# Patient Record
Sex: Male | Born: 1945
Health system: Southern US, Community
[De-identification: ages and names within clinical notes are randomized; demographics above are authoritative.]

## PROBLEM LIST (undated history)

## (undated) DIAGNOSIS — N289 Disorder of kidney and ureter, unspecified: Secondary | ICD-10-CM

## (undated) DIAGNOSIS — H9193 Unspecified hearing loss, bilateral: Secondary | ICD-10-CM

## (undated) DIAGNOSIS — R413 Other amnesia: Secondary | ICD-10-CM

---

## 2011-05-05 DIAGNOSIS — I10 Essential (primary) hypertension: Secondary | ICD-10-CM | POA: Diagnosis not present

## 2011-05-05 DIAGNOSIS — E7800 Pure hypercholesterolemia, unspecified: Secondary | ICD-10-CM | POA: Diagnosis not present

## 2011-05-05 DIAGNOSIS — F17200 Nicotine dependence, unspecified, uncomplicated: Secondary | ICD-10-CM | POA: Diagnosis not present

## 2011-05-05 DIAGNOSIS — N189 Chronic kidney disease, unspecified: Secondary | ICD-10-CM | POA: Diagnosis not present

## 2011-05-17 DIAGNOSIS — I129 Hypertensive chronic kidney disease with stage 1 through stage 4 chronic kidney disease, or unspecified chronic kidney disease: Secondary | ICD-10-CM | POA: Diagnosis not present

## 2011-05-17 DIAGNOSIS — N2589 Other disorders resulting from impaired renal tubular function: Secondary | ICD-10-CM | POA: Diagnosis not present

## 2011-05-17 DIAGNOSIS — N189 Chronic kidney disease, unspecified: Secondary | ICD-10-CM | POA: Diagnosis not present

## 2011-05-17 DIAGNOSIS — Z7982 Long term (current) use of aspirin: Secondary | ICD-10-CM | POA: Diagnosis not present

## 2011-05-17 DIAGNOSIS — Z79899 Other long term (current) drug therapy: Secondary | ICD-10-CM | POA: Diagnosis not present

## 2011-05-17 DIAGNOSIS — Z23 Encounter for immunization: Secondary | ICD-10-CM | POA: Diagnosis not present

## 2011-05-18 DIAGNOSIS — N183 Chronic kidney disease, stage 3 unspecified: Secondary | ICD-10-CM | POA: Insufficient documentation

## 2011-05-18 DIAGNOSIS — I10 Essential (primary) hypertension: Secondary | ICD-10-CM | POA: Insufficient documentation

## 2011-07-29 DIAGNOSIS — Z125 Encounter for screening for malignant neoplasm of prostate: Secondary | ICD-10-CM | POA: Diagnosis not present

## 2011-07-29 DIAGNOSIS — I10 Essential (primary) hypertension: Secondary | ICD-10-CM | POA: Diagnosis not present

## 2011-07-29 DIAGNOSIS — Z0000 Encounter for general adult medical examination without abnormal findings: Secondary | ICD-10-CM | POA: Diagnosis not present

## 2011-10-28 DIAGNOSIS — I10 Essential (primary) hypertension: Secondary | ICD-10-CM | POA: Diagnosis not present

## 2011-10-28 DIAGNOSIS — Z6831 Body mass index (BMI) 31.0-31.9, adult: Secondary | ICD-10-CM | POA: Diagnosis not present

## 2011-10-28 DIAGNOSIS — N189 Chronic kidney disease, unspecified: Secondary | ICD-10-CM | POA: Diagnosis not present

## 2011-10-28 DIAGNOSIS — E7800 Pure hypercholesterolemia, unspecified: Secondary | ICD-10-CM | POA: Diagnosis not present

## 2011-11-15 DIAGNOSIS — N2589 Other disorders resulting from impaired renal tubular function: Secondary | ICD-10-CM | POA: Diagnosis not present

## 2011-11-15 DIAGNOSIS — N200 Calculus of kidney: Secondary | ICD-10-CM | POA: Insufficient documentation

## 2011-11-15 DIAGNOSIS — I129 Hypertensive chronic kidney disease with stage 1 through stage 4 chronic kidney disease, or unspecified chronic kidney disease: Secondary | ICD-10-CM | POA: Diagnosis not present

## 2011-11-15 DIAGNOSIS — N183 Chronic kidney disease, stage 3 unspecified: Secondary | ICD-10-CM | POA: Diagnosis not present

## 2011-11-15 DIAGNOSIS — R7989 Other specified abnormal findings of blood chemistry: Secondary | ICD-10-CM | POA: Diagnosis not present

## 2011-11-21 DIAGNOSIS — F17200 Nicotine dependence, unspecified, uncomplicated: Secondary | ICD-10-CM | POA: Diagnosis not present

## 2011-11-21 DIAGNOSIS — N189 Chronic kidney disease, unspecified: Secondary | ICD-10-CM | POA: Diagnosis not present

## 2011-11-21 DIAGNOSIS — M25519 Pain in unspecified shoulder: Secondary | ICD-10-CM | POA: Diagnosis not present

## 2011-11-21 DIAGNOSIS — I10 Essential (primary) hypertension: Secondary | ICD-10-CM | POA: Diagnosis not present

## 2011-11-21 DIAGNOSIS — E7800 Pure hypercholesterolemia, unspecified: Secondary | ICD-10-CM | POA: Diagnosis not present

## 2011-11-23 DIAGNOSIS — N2589 Other disorders resulting from impaired renal tubular function: Secondary | ICD-10-CM | POA: Diagnosis not present

## 2011-11-23 DIAGNOSIS — I129 Hypertensive chronic kidney disease with stage 1 through stage 4 chronic kidney disease, or unspecified chronic kidney disease: Secondary | ICD-10-CM | POA: Diagnosis not present

## 2011-12-01 DIAGNOSIS — I10 Essential (primary) hypertension: Secondary | ICD-10-CM | POA: Diagnosis not present

## 2011-12-01 DIAGNOSIS — F17200 Nicotine dependence, unspecified, uncomplicated: Secondary | ICD-10-CM | POA: Diagnosis not present

## 2011-12-01 DIAGNOSIS — N189 Chronic kidney disease, unspecified: Secondary | ICD-10-CM | POA: Diagnosis not present

## 2011-12-01 DIAGNOSIS — M25519 Pain in unspecified shoulder: Secondary | ICD-10-CM | POA: Diagnosis not present

## 2011-12-01 DIAGNOSIS — E7800 Pure hypercholesterolemia, unspecified: Secondary | ICD-10-CM | POA: Diagnosis not present

## 2012-01-27 DIAGNOSIS — Z23 Encounter for immunization: Secondary | ICD-10-CM | POA: Diagnosis not present

## 2012-01-27 DIAGNOSIS — Z6830 Body mass index (BMI) 30.0-30.9, adult: Secondary | ICD-10-CM | POA: Diagnosis not present

## 2012-01-27 DIAGNOSIS — I10 Essential (primary) hypertension: Secondary | ICD-10-CM | POA: Diagnosis not present

## 2012-01-27 DIAGNOSIS — N189 Chronic kidney disease, unspecified: Secondary | ICD-10-CM | POA: Diagnosis not present

## 2012-01-27 DIAGNOSIS — E7800 Pure hypercholesterolemia, unspecified: Secondary | ICD-10-CM | POA: Diagnosis not present

## 2012-02-28 DIAGNOSIS — J209 Acute bronchitis, unspecified: Secondary | ICD-10-CM | POA: Diagnosis not present

## 2012-03-05 DIAGNOSIS — Z1211 Encounter for screening for malignant neoplasm of colon: Secondary | ICD-10-CM | POA: Diagnosis not present

## 2012-03-05 DIAGNOSIS — Z86010 Personal history of colonic polyps: Secondary | ICD-10-CM | POA: Diagnosis not present

## 2012-04-16 DIAGNOSIS — J209 Acute bronchitis, unspecified: Secondary | ICD-10-CM | POA: Diagnosis not present

## 2012-04-16 DIAGNOSIS — Z6831 Body mass index (BMI) 31.0-31.9, adult: Secondary | ICD-10-CM | POA: Diagnosis not present

## 2012-04-16 DIAGNOSIS — E7800 Pure hypercholesterolemia, unspecified: Secondary | ICD-10-CM | POA: Diagnosis not present

## 2012-04-16 DIAGNOSIS — N189 Chronic kidney disease, unspecified: Secondary | ICD-10-CM | POA: Diagnosis not present

## 2012-05-11 DIAGNOSIS — Z6830 Body mass index (BMI) 30.0-30.9, adult: Secondary | ICD-10-CM | POA: Diagnosis not present

## 2012-05-11 DIAGNOSIS — M65839 Other synovitis and tenosynovitis, unspecified forearm: Secondary | ICD-10-CM | POA: Diagnosis not present

## 2012-05-11 DIAGNOSIS — E7800 Pure hypercholesterolemia, unspecified: Secondary | ICD-10-CM | POA: Diagnosis not present

## 2012-05-11 DIAGNOSIS — N189 Chronic kidney disease, unspecified: Secondary | ICD-10-CM | POA: Diagnosis not present

## 2012-05-11 DIAGNOSIS — I10 Essential (primary) hypertension: Secondary | ICD-10-CM | POA: Diagnosis not present

## 2012-05-22 DIAGNOSIS — J209 Acute bronchitis, unspecified: Secondary | ICD-10-CM | POA: Diagnosis not present

## 2012-05-22 DIAGNOSIS — J019 Acute sinusitis, unspecified: Secondary | ICD-10-CM | POA: Diagnosis not present

## 2012-06-08 DIAGNOSIS — E7800 Pure hypercholesterolemia, unspecified: Secondary | ICD-10-CM | POA: Diagnosis not present

## 2012-06-08 DIAGNOSIS — Z6830 Body mass index (BMI) 30.0-30.9, adult: Secondary | ICD-10-CM | POA: Diagnosis not present

## 2012-06-08 DIAGNOSIS — M65839 Other synovitis and tenosynovitis, unspecified forearm: Secondary | ICD-10-CM | POA: Diagnosis not present

## 2012-07-08 DIAGNOSIS — K648 Other hemorrhoids: Secondary | ICD-10-CM | POA: Diagnosis not present

## 2012-07-08 DIAGNOSIS — I129 Hypertensive chronic kidney disease with stage 1 through stage 4 chronic kidney disease, or unspecified chronic kidney disease: Secondary | ICD-10-CM | POA: Diagnosis not present

## 2012-07-08 DIAGNOSIS — Z1211 Encounter for screening for malignant neoplasm of colon: Secondary | ICD-10-CM | POA: Diagnosis not present

## 2012-07-08 DIAGNOSIS — H9190 Unspecified hearing loss, unspecified ear: Secondary | ICD-10-CM | POA: Diagnosis not present

## 2012-07-08 DIAGNOSIS — F17200 Nicotine dependence, unspecified, uncomplicated: Secondary | ICD-10-CM | POA: Diagnosis not present

## 2012-07-08 DIAGNOSIS — Z79899 Other long term (current) drug therapy: Secondary | ICD-10-CM | POA: Diagnosis not present

## 2012-07-08 DIAGNOSIS — Z7982 Long term (current) use of aspirin: Secondary | ICD-10-CM | POA: Diagnosis not present

## 2012-07-08 DIAGNOSIS — Z86010 Personal history of colonic polyps: Secondary | ICD-10-CM | POA: Diagnosis not present

## 2012-07-08 DIAGNOSIS — K5730 Diverticulosis of large intestine without perforation or abscess without bleeding: Secondary | ICD-10-CM | POA: Diagnosis not present

## 2012-07-08 DIAGNOSIS — E7800 Pure hypercholesterolemia, unspecified: Secondary | ICD-10-CM | POA: Diagnosis not present

## 2012-07-08 DIAGNOSIS — N189 Chronic kidney disease, unspecified: Secondary | ICD-10-CM | POA: Diagnosis not present

## 2012-07-09 DIAGNOSIS — M25549 Pain in joints of unspecified hand: Secondary | ICD-10-CM | POA: Diagnosis not present

## 2012-07-09 DIAGNOSIS — M19049 Primary osteoarthritis, unspecified hand: Secondary | ICD-10-CM | POA: Diagnosis not present

## 2012-07-31 DIAGNOSIS — J209 Acute bronchitis, unspecified: Secondary | ICD-10-CM | POA: Diagnosis not present

## 2012-08-10 DIAGNOSIS — R229 Localized swelling, mass and lump, unspecified: Secondary | ICD-10-CM | POA: Diagnosis not present

## 2012-08-10 DIAGNOSIS — E8881 Metabolic syndrome: Secondary | ICD-10-CM | POA: Diagnosis not present

## 2012-08-10 DIAGNOSIS — Z6830 Body mass index (BMI) 30.0-30.9, adult: Secondary | ICD-10-CM | POA: Diagnosis not present

## 2012-08-10 DIAGNOSIS — E7800 Pure hypercholesterolemia, unspecified: Secondary | ICD-10-CM | POA: Diagnosis not present

## 2012-08-10 DIAGNOSIS — Z9181 History of falling: Secondary | ICD-10-CM | POA: Diagnosis not present

## 2012-08-10 DIAGNOSIS — Z1331 Encounter for screening for depression: Secondary | ICD-10-CM | POA: Diagnosis not present

## 2012-08-15 DIAGNOSIS — H6120 Impacted cerumen, unspecified ear: Secondary | ICD-10-CM | POA: Diagnosis not present

## 2012-08-15 DIAGNOSIS — H903 Sensorineural hearing loss, bilateral: Secondary | ICD-10-CM | POA: Diagnosis not present

## 2012-08-19 DIAGNOSIS — D2370 Other benign neoplasm of skin of unspecified lower limb, including hip: Secondary | ICD-10-CM | POA: Diagnosis not present

## 2012-08-19 DIAGNOSIS — I10 Essential (primary) hypertension: Secondary | ICD-10-CM | POA: Diagnosis not present

## 2012-08-19 DIAGNOSIS — L723 Sebaceous cyst: Secondary | ICD-10-CM | POA: Diagnosis not present

## 2012-08-19 DIAGNOSIS — M79609 Pain in unspecified limb: Secondary | ICD-10-CM | POA: Diagnosis not present

## 2012-08-19 DIAGNOSIS — H9190 Unspecified hearing loss, unspecified ear: Secondary | ICD-10-CM | POA: Diagnosis not present

## 2012-08-29 DIAGNOSIS — N189 Chronic kidney disease, unspecified: Secondary | ICD-10-CM | POA: Diagnosis not present

## 2012-08-29 DIAGNOSIS — L259 Unspecified contact dermatitis, unspecified cause: Secondary | ICD-10-CM | POA: Diagnosis not present

## 2012-08-29 DIAGNOSIS — E7800 Pure hypercholesterolemia, unspecified: Secondary | ICD-10-CM | POA: Diagnosis not present

## 2012-08-29 DIAGNOSIS — Z6830 Body mass index (BMI) 30.0-30.9, adult: Secondary | ICD-10-CM | POA: Diagnosis not present

## 2012-09-04 DIAGNOSIS — E7800 Pure hypercholesterolemia, unspecified: Secondary | ICD-10-CM | POA: Diagnosis not present

## 2012-09-04 DIAGNOSIS — J209 Acute bronchitis, unspecified: Secondary | ICD-10-CM | POA: Diagnosis not present

## 2012-09-04 DIAGNOSIS — N189 Chronic kidney disease, unspecified: Secondary | ICD-10-CM | POA: Diagnosis not present

## 2012-09-04 DIAGNOSIS — Z681 Body mass index (BMI) 19 or less, adult: Secondary | ICD-10-CM | POA: Diagnosis not present

## 2012-09-12 DIAGNOSIS — M25549 Pain in joints of unspecified hand: Secondary | ICD-10-CM | POA: Diagnosis not present

## 2012-09-12 DIAGNOSIS — M19049 Primary osteoarthritis, unspecified hand: Secondary | ICD-10-CM | POA: Diagnosis not present

## 2012-10-24 DIAGNOSIS — M19049 Primary osteoarthritis, unspecified hand: Secondary | ICD-10-CM | POA: Diagnosis not present

## 2012-11-27 DIAGNOSIS — I129 Hypertensive chronic kidney disease with stage 1 through stage 4 chronic kidney disease, or unspecified chronic kidney disease: Secondary | ICD-10-CM | POA: Diagnosis not present

## 2012-11-27 DIAGNOSIS — N2589 Other disorders resulting from impaired renal tubular function: Secondary | ICD-10-CM | POA: Diagnosis not present

## 2012-11-27 DIAGNOSIS — N183 Chronic kidney disease, stage 3 unspecified: Secondary | ICD-10-CM | POA: Diagnosis not present

## 2012-11-27 DIAGNOSIS — N200 Calculus of kidney: Secondary | ICD-10-CM | POA: Diagnosis not present

## 2012-11-30 DIAGNOSIS — N189 Chronic kidney disease, unspecified: Secondary | ICD-10-CM | POA: Diagnosis not present

## 2012-11-30 DIAGNOSIS — I10 Essential (primary) hypertension: Secondary | ICD-10-CM | POA: Diagnosis not present

## 2012-11-30 DIAGNOSIS — E7800 Pure hypercholesterolemia, unspecified: Secondary | ICD-10-CM | POA: Diagnosis not present

## 2012-11-30 DIAGNOSIS — Z6830 Body mass index (BMI) 30.0-30.9, adult: Secondary | ICD-10-CM | POA: Diagnosis not present

## 2012-11-30 DIAGNOSIS — E8881 Metabolic syndrome: Secondary | ICD-10-CM | POA: Diagnosis not present

## 2012-12-29 DIAGNOSIS — R05 Cough: Secondary | ICD-10-CM | POA: Diagnosis not present

## 2012-12-29 DIAGNOSIS — J069 Acute upper respiratory infection, unspecified: Secondary | ICD-10-CM | POA: Diagnosis not present

## 2013-02-17 DIAGNOSIS — M19049 Primary osteoarthritis, unspecified hand: Secondary | ICD-10-CM | POA: Diagnosis not present

## 2013-03-04 DIAGNOSIS — B356 Tinea cruris: Secondary | ICD-10-CM | POA: Diagnosis not present

## 2013-03-11 DIAGNOSIS — Z23 Encounter for immunization: Secondary | ICD-10-CM | POA: Diagnosis not present

## 2013-03-11 DIAGNOSIS — Z0000 Encounter for general adult medical examination without abnormal findings: Secondary | ICD-10-CM | POA: Diagnosis not present

## 2013-03-11 DIAGNOSIS — I10 Essential (primary) hypertension: Secondary | ICD-10-CM | POA: Diagnosis not present

## 2013-03-11 DIAGNOSIS — Z125 Encounter for screening for malignant neoplasm of prostate: Secondary | ICD-10-CM | POA: Diagnosis not present

## 2013-03-14 DIAGNOSIS — M19049 Primary osteoarthritis, unspecified hand: Secondary | ICD-10-CM | POA: Diagnosis not present

## 2013-03-14 DIAGNOSIS — I10 Essential (primary) hypertension: Secondary | ICD-10-CM | POA: Diagnosis not present

## 2013-04-14 DIAGNOSIS — R059 Cough, unspecified: Secondary | ICD-10-CM | POA: Diagnosis not present

## 2013-04-14 DIAGNOSIS — J069 Acute upper respiratory infection, unspecified: Secondary | ICD-10-CM | POA: Diagnosis not present

## 2013-04-17 DIAGNOSIS — H6120 Impacted cerumen, unspecified ear: Secondary | ICD-10-CM | POA: Diagnosis not present

## 2013-05-21 DIAGNOSIS — I129 Hypertensive chronic kidney disease with stage 1 through stage 4 chronic kidney disease, or unspecified chronic kidney disease: Secondary | ICD-10-CM | POA: Diagnosis not present

## 2013-05-21 DIAGNOSIS — N200 Calculus of kidney: Secondary | ICD-10-CM | POA: Diagnosis not present

## 2013-05-21 DIAGNOSIS — N183 Chronic kidney disease, stage 3 unspecified: Secondary | ICD-10-CM | POA: Diagnosis not present

## 2013-05-21 DIAGNOSIS — R7989 Other specified abnormal findings of blood chemistry: Secondary | ICD-10-CM | POA: Diagnosis not present

## 2013-05-21 DIAGNOSIS — N2589 Other disorders resulting from impaired renal tubular function: Secondary | ICD-10-CM | POA: Diagnosis not present

## 2013-05-28 DIAGNOSIS — M6281 Muscle weakness (generalized): Secondary | ICD-10-CM | POA: Diagnosis not present

## 2013-05-28 DIAGNOSIS — M25649 Stiffness of unspecified hand, not elsewhere classified: Secondary | ICD-10-CM | POA: Diagnosis not present

## 2013-05-28 DIAGNOSIS — M2550 Pain in unspecified joint: Secondary | ICD-10-CM | POA: Diagnosis not present

## 2013-05-28 DIAGNOSIS — M19049 Primary osteoarthritis, unspecified hand: Secondary | ICD-10-CM | POA: Diagnosis not present

## 2013-05-30 DIAGNOSIS — M2550 Pain in unspecified joint: Secondary | ICD-10-CM | POA: Diagnosis not present

## 2013-05-30 DIAGNOSIS — M19049 Primary osteoarthritis, unspecified hand: Secondary | ICD-10-CM | POA: Diagnosis not present

## 2013-05-30 DIAGNOSIS — M25649 Stiffness of unspecified hand, not elsewhere classified: Secondary | ICD-10-CM | POA: Diagnosis not present

## 2013-05-30 DIAGNOSIS — M6281 Muscle weakness (generalized): Secondary | ICD-10-CM | POA: Diagnosis not present

## 2013-06-11 DIAGNOSIS — M25649 Stiffness of unspecified hand, not elsewhere classified: Secondary | ICD-10-CM | POA: Diagnosis not present

## 2013-06-11 DIAGNOSIS — M2550 Pain in unspecified joint: Secondary | ICD-10-CM | POA: Diagnosis not present

## 2013-06-11 DIAGNOSIS — M6281 Muscle weakness (generalized): Secondary | ICD-10-CM | POA: Diagnosis not present

## 2013-06-11 DIAGNOSIS — M19049 Primary osteoarthritis, unspecified hand: Secondary | ICD-10-CM | POA: Diagnosis not present

## 2013-06-13 DIAGNOSIS — E8881 Metabolic syndrome: Secondary | ICD-10-CM | POA: Diagnosis not present

## 2013-06-13 DIAGNOSIS — I10 Essential (primary) hypertension: Secondary | ICD-10-CM | POA: Diagnosis not present

## 2013-06-13 DIAGNOSIS — E7800 Pure hypercholesterolemia, unspecified: Secondary | ICD-10-CM | POA: Diagnosis not present

## 2013-06-13 DIAGNOSIS — F17200 Nicotine dependence, unspecified, uncomplicated: Secondary | ICD-10-CM | POA: Diagnosis not present

## 2013-06-17 DIAGNOSIS — I129 Hypertensive chronic kidney disease with stage 1 through stage 4 chronic kidney disease, or unspecified chronic kidney disease: Secondary | ICD-10-CM | POA: Diagnosis not present

## 2013-06-17 DIAGNOSIS — N183 Chronic kidney disease, stage 3 unspecified: Secondary | ICD-10-CM | POA: Diagnosis not present

## 2013-06-18 DIAGNOSIS — M19049 Primary osteoarthritis, unspecified hand: Secondary | ICD-10-CM | POA: Diagnosis not present

## 2013-06-18 DIAGNOSIS — M25649 Stiffness of unspecified hand, not elsewhere classified: Secondary | ICD-10-CM | POA: Diagnosis not present

## 2013-06-18 DIAGNOSIS — M2550 Pain in unspecified joint: Secondary | ICD-10-CM | POA: Diagnosis not present

## 2013-06-18 DIAGNOSIS — M6281 Muscle weakness (generalized): Secondary | ICD-10-CM | POA: Diagnosis not present

## 2013-06-20 DIAGNOSIS — M6281 Muscle weakness (generalized): Secondary | ICD-10-CM | POA: Diagnosis not present

## 2013-06-20 DIAGNOSIS — M2550 Pain in unspecified joint: Secondary | ICD-10-CM | POA: Diagnosis not present

## 2013-06-20 DIAGNOSIS — M25649 Stiffness of unspecified hand, not elsewhere classified: Secondary | ICD-10-CM | POA: Diagnosis not present

## 2013-06-20 DIAGNOSIS — M19049 Primary osteoarthritis, unspecified hand: Secondary | ICD-10-CM | POA: Diagnosis not present

## 2013-06-25 DIAGNOSIS — M25649 Stiffness of unspecified hand, not elsewhere classified: Secondary | ICD-10-CM | POA: Diagnosis not present

## 2013-06-25 DIAGNOSIS — M6281 Muscle weakness (generalized): Secondary | ICD-10-CM | POA: Diagnosis not present

## 2013-06-25 DIAGNOSIS — M2550 Pain in unspecified joint: Secondary | ICD-10-CM | POA: Diagnosis not present

## 2013-06-25 DIAGNOSIS — M19049 Primary osteoarthritis, unspecified hand: Secondary | ICD-10-CM | POA: Diagnosis not present

## 2013-06-27 DIAGNOSIS — M6281 Muscle weakness (generalized): Secondary | ICD-10-CM | POA: Diagnosis not present

## 2013-06-27 DIAGNOSIS — M2550 Pain in unspecified joint: Secondary | ICD-10-CM | POA: Diagnosis not present

## 2013-06-27 DIAGNOSIS — M19049 Primary osteoarthritis, unspecified hand: Secondary | ICD-10-CM | POA: Diagnosis not present

## 2013-06-27 DIAGNOSIS — M25649 Stiffness of unspecified hand, not elsewhere classified: Secondary | ICD-10-CM | POA: Diagnosis not present

## 2013-06-30 DIAGNOSIS — M19049 Primary osteoarthritis, unspecified hand: Secondary | ICD-10-CM | POA: Diagnosis not present

## 2013-07-01 DIAGNOSIS — M2550 Pain in unspecified joint: Secondary | ICD-10-CM | POA: Diagnosis not present

## 2013-07-01 DIAGNOSIS — M6281 Muscle weakness (generalized): Secondary | ICD-10-CM | POA: Diagnosis not present

## 2013-07-01 DIAGNOSIS — M25649 Stiffness of unspecified hand, not elsewhere classified: Secondary | ICD-10-CM | POA: Diagnosis not present

## 2013-07-01 DIAGNOSIS — M19049 Primary osteoarthritis, unspecified hand: Secondary | ICD-10-CM | POA: Diagnosis not present

## 2013-07-03 DIAGNOSIS — M19049 Primary osteoarthritis, unspecified hand: Secondary | ICD-10-CM | POA: Diagnosis not present

## 2013-07-03 DIAGNOSIS — M6281 Muscle weakness (generalized): Secondary | ICD-10-CM | POA: Diagnosis not present

## 2013-07-03 DIAGNOSIS — M25649 Stiffness of unspecified hand, not elsewhere classified: Secondary | ICD-10-CM | POA: Diagnosis not present

## 2013-07-03 DIAGNOSIS — M2550 Pain in unspecified joint: Secondary | ICD-10-CM | POA: Diagnosis not present

## 2013-07-09 DIAGNOSIS — M25649 Stiffness of unspecified hand, not elsewhere classified: Secondary | ICD-10-CM | POA: Diagnosis not present

## 2013-07-09 DIAGNOSIS — M19049 Primary osteoarthritis, unspecified hand: Secondary | ICD-10-CM | POA: Diagnosis not present

## 2013-07-09 DIAGNOSIS — M6281 Muscle weakness (generalized): Secondary | ICD-10-CM | POA: Diagnosis not present

## 2013-07-09 DIAGNOSIS — M2550 Pain in unspecified joint: Secondary | ICD-10-CM | POA: Diagnosis not present

## 2013-07-11 DIAGNOSIS — M19049 Primary osteoarthritis, unspecified hand: Secondary | ICD-10-CM | POA: Diagnosis not present

## 2013-07-11 DIAGNOSIS — M6281 Muscle weakness (generalized): Secondary | ICD-10-CM | POA: Diagnosis not present

## 2013-07-11 DIAGNOSIS — M25649 Stiffness of unspecified hand, not elsewhere classified: Secondary | ICD-10-CM | POA: Diagnosis not present

## 2013-07-11 DIAGNOSIS — M2550 Pain in unspecified joint: Secondary | ICD-10-CM | POA: Diagnosis not present

## 2013-07-18 DIAGNOSIS — M25649 Stiffness of unspecified hand, not elsewhere classified: Secondary | ICD-10-CM | POA: Diagnosis not present

## 2013-07-18 DIAGNOSIS — M19049 Primary osteoarthritis, unspecified hand: Secondary | ICD-10-CM | POA: Diagnosis not present

## 2013-07-18 DIAGNOSIS — M2550 Pain in unspecified joint: Secondary | ICD-10-CM | POA: Diagnosis not present

## 2013-07-18 DIAGNOSIS — M6281 Muscle weakness (generalized): Secondary | ICD-10-CM | POA: Diagnosis not present

## 2013-07-23 DIAGNOSIS — M25649 Stiffness of unspecified hand, not elsewhere classified: Secondary | ICD-10-CM | POA: Diagnosis not present

## 2013-07-23 DIAGNOSIS — M19049 Primary osteoarthritis, unspecified hand: Secondary | ICD-10-CM | POA: Diagnosis not present

## 2013-07-23 DIAGNOSIS — M6281 Muscle weakness (generalized): Secondary | ICD-10-CM | POA: Diagnosis not present

## 2013-07-23 DIAGNOSIS — M2550 Pain in unspecified joint: Secondary | ICD-10-CM | POA: Diagnosis not present

## 2013-07-25 DIAGNOSIS — M6281 Muscle weakness (generalized): Secondary | ICD-10-CM | POA: Diagnosis not present

## 2013-07-25 DIAGNOSIS — M2550 Pain in unspecified joint: Secondary | ICD-10-CM | POA: Diagnosis not present

## 2013-07-25 DIAGNOSIS — M25649 Stiffness of unspecified hand, not elsewhere classified: Secondary | ICD-10-CM | POA: Diagnosis not present

## 2013-07-25 DIAGNOSIS — M19049 Primary osteoarthritis, unspecified hand: Secondary | ICD-10-CM | POA: Diagnosis not present

## 2013-07-30 DIAGNOSIS — M2550 Pain in unspecified joint: Secondary | ICD-10-CM | POA: Diagnosis not present

## 2013-07-30 DIAGNOSIS — M25649 Stiffness of unspecified hand, not elsewhere classified: Secondary | ICD-10-CM | POA: Diagnosis not present

## 2013-07-30 DIAGNOSIS — M19049 Primary osteoarthritis, unspecified hand: Secondary | ICD-10-CM | POA: Diagnosis not present

## 2013-07-30 DIAGNOSIS — M6281 Muscle weakness (generalized): Secondary | ICD-10-CM | POA: Diagnosis not present

## 2013-08-06 DIAGNOSIS — M25649 Stiffness of unspecified hand, not elsewhere classified: Secondary | ICD-10-CM | POA: Diagnosis not present

## 2013-08-06 DIAGNOSIS — M6281 Muscle weakness (generalized): Secondary | ICD-10-CM | POA: Diagnosis not present

## 2013-08-06 DIAGNOSIS — M19049 Primary osteoarthritis, unspecified hand: Secondary | ICD-10-CM | POA: Diagnosis not present

## 2013-08-06 DIAGNOSIS — M2550 Pain in unspecified joint: Secondary | ICD-10-CM | POA: Diagnosis not present

## 2013-08-08 DIAGNOSIS — M2550 Pain in unspecified joint: Secondary | ICD-10-CM | POA: Diagnosis not present

## 2013-08-08 DIAGNOSIS — M25649 Stiffness of unspecified hand, not elsewhere classified: Secondary | ICD-10-CM | POA: Diagnosis not present

## 2013-08-08 DIAGNOSIS — M19049 Primary osteoarthritis, unspecified hand: Secondary | ICD-10-CM | POA: Diagnosis not present

## 2013-08-08 DIAGNOSIS — M6281 Muscle weakness (generalized): Secondary | ICD-10-CM | POA: Diagnosis not present

## 2013-08-12 DIAGNOSIS — M19049 Primary osteoarthritis, unspecified hand: Secondary | ICD-10-CM | POA: Diagnosis not present

## 2013-08-19 DIAGNOSIS — L0291 Cutaneous abscess, unspecified: Secondary | ICD-10-CM | POA: Diagnosis not present

## 2013-08-19 DIAGNOSIS — E8881 Metabolic syndrome: Secondary | ICD-10-CM | POA: Diagnosis not present

## 2013-08-19 DIAGNOSIS — E7800 Pure hypercholesterolemia, unspecified: Secondary | ICD-10-CM | POA: Diagnosis not present

## 2013-08-19 DIAGNOSIS — I10 Essential (primary) hypertension: Secondary | ICD-10-CM | POA: Diagnosis not present

## 2013-08-19 DIAGNOSIS — L0390 Cellulitis, unspecified: Secondary | ICD-10-CM | POA: Diagnosis not present

## 2013-08-26 DIAGNOSIS — N189 Chronic kidney disease, unspecified: Secondary | ICD-10-CM | POA: Diagnosis not present

## 2013-08-26 DIAGNOSIS — L0291 Cutaneous abscess, unspecified: Secondary | ICD-10-CM | POA: Diagnosis not present

## 2013-08-26 DIAGNOSIS — E8881 Metabolic syndrome: Secondary | ICD-10-CM | POA: Diagnosis not present

## 2013-08-26 DIAGNOSIS — I10 Essential (primary) hypertension: Secondary | ICD-10-CM | POA: Diagnosis not present

## 2013-08-26 DIAGNOSIS — L0390 Cellulitis, unspecified: Secondary | ICD-10-CM | POA: Diagnosis not present

## 2013-09-09 DIAGNOSIS — R05 Cough: Secondary | ICD-10-CM | POA: Diagnosis not present

## 2013-09-09 DIAGNOSIS — J209 Acute bronchitis, unspecified: Secondary | ICD-10-CM | POA: Diagnosis not present

## 2013-09-09 DIAGNOSIS — R059 Cough, unspecified: Secondary | ICD-10-CM | POA: Diagnosis not present

## 2013-11-26 DIAGNOSIS — R351 Nocturia: Secondary | ICD-10-CM | POA: Diagnosis not present

## 2013-11-26 DIAGNOSIS — N2589 Other disorders resulting from impaired renal tubular function: Secondary | ICD-10-CM | POA: Diagnosis not present

## 2013-11-26 DIAGNOSIS — I129 Hypertensive chronic kidney disease with stage 1 through stage 4 chronic kidney disease, or unspecified chronic kidney disease: Secondary | ICD-10-CM | POA: Diagnosis not present

## 2013-11-26 DIAGNOSIS — N200 Calculus of kidney: Secondary | ICD-10-CM | POA: Diagnosis not present

## 2013-11-26 DIAGNOSIS — Z87891 Personal history of nicotine dependence: Secondary | ICD-10-CM | POA: Diagnosis not present

## 2013-11-26 DIAGNOSIS — N183 Chronic kidney disease, stage 3 unspecified: Secondary | ICD-10-CM | POA: Diagnosis not present

## 2013-12-04 DIAGNOSIS — N318 Other neuromuscular dysfunction of bladder: Secondary | ICD-10-CM | POA: Diagnosis not present

## 2013-12-04 DIAGNOSIS — N390 Urinary tract infection, site not specified: Secondary | ICD-10-CM | POA: Diagnosis not present

## 2013-12-04 DIAGNOSIS — N400 Enlarged prostate without lower urinary tract symptoms: Secondary | ICD-10-CM | POA: Diagnosis not present

## 2013-12-06 DIAGNOSIS — M25519 Pain in unspecified shoulder: Secondary | ICD-10-CM | POA: Diagnosis not present

## 2013-12-06 DIAGNOSIS — I10 Essential (primary) hypertension: Secondary | ICD-10-CM | POA: Diagnosis not present

## 2013-12-06 DIAGNOSIS — E8881 Metabolic syndrome: Secondary | ICD-10-CM | POA: Diagnosis not present

## 2013-12-06 DIAGNOSIS — N189 Chronic kidney disease, unspecified: Secondary | ICD-10-CM | POA: Diagnosis not present

## 2013-12-06 DIAGNOSIS — E7800 Pure hypercholesterolemia, unspecified: Secondary | ICD-10-CM | POA: Diagnosis not present

## 2014-01-01 DIAGNOSIS — N390 Urinary tract infection, site not specified: Secondary | ICD-10-CM | POA: Diagnosis not present

## 2014-01-01 DIAGNOSIS — N318 Other neuromuscular dysfunction of bladder: Secondary | ICD-10-CM | POA: Diagnosis not present

## 2014-01-01 DIAGNOSIS — N401 Enlarged prostate with lower urinary tract symptoms: Secondary | ICD-10-CM | POA: Diagnosis not present

## 2014-01-04 DIAGNOSIS — J209 Acute bronchitis, unspecified: Secondary | ICD-10-CM | POA: Diagnosis not present

## 2014-01-04 DIAGNOSIS — J0100 Acute maxillary sinusitis, unspecified: Secondary | ICD-10-CM | POA: Diagnosis not present

## 2014-01-05 DIAGNOSIS — R350 Frequency of micturition: Secondary | ICD-10-CM | POA: Diagnosis not present

## 2014-01-05 DIAGNOSIS — R351 Nocturia: Secondary | ICD-10-CM | POA: Diagnosis not present

## 2014-01-05 DIAGNOSIS — R3915 Urgency of urination: Secondary | ICD-10-CM | POA: Diagnosis not present

## 2014-01-05 DIAGNOSIS — N401 Enlarged prostate with lower urinary tract symptoms: Secondary | ICD-10-CM | POA: Diagnosis not present

## 2014-01-07 DIAGNOSIS — R351 Nocturia: Secondary | ICD-10-CM | POA: Diagnosis not present

## 2014-01-07 DIAGNOSIS — Z87891 Personal history of nicotine dependence: Secondary | ICD-10-CM | POA: Diagnosis not present

## 2014-01-07 DIAGNOSIS — N400 Enlarged prostate without lower urinary tract symptoms: Secondary | ICD-10-CM | POA: Diagnosis not present

## 2014-01-07 DIAGNOSIS — H9190 Unspecified hearing loss, unspecified ear: Secondary | ICD-10-CM | POA: Diagnosis not present

## 2014-01-07 DIAGNOSIS — N401 Enlarged prostate with lower urinary tract symptoms: Secondary | ICD-10-CM | POA: Diagnosis not present

## 2014-01-08 DIAGNOSIS — N401 Enlarged prostate with lower urinary tract symptoms: Secondary | ICD-10-CM | POA: Diagnosis not present

## 2014-01-08 DIAGNOSIS — Z23 Encounter for immunization: Secondary | ICD-10-CM | POA: Diagnosis not present

## 2014-01-08 DIAGNOSIS — Z87891 Personal history of nicotine dependence: Secondary | ICD-10-CM | POA: Diagnosis not present

## 2014-01-08 DIAGNOSIS — R351 Nocturia: Secondary | ICD-10-CM | POA: Diagnosis not present

## 2014-01-12 DIAGNOSIS — N3281 Overactive bladder: Secondary | ICD-10-CM | POA: Diagnosis not present

## 2014-01-12 DIAGNOSIS — R350 Frequency of micturition: Secondary | ICD-10-CM | POA: Diagnosis not present

## 2014-01-12 DIAGNOSIS — N401 Enlarged prostate with lower urinary tract symptoms: Secondary | ICD-10-CM | POA: Diagnosis not present

## 2014-01-12 DIAGNOSIS — N3090 Cystitis, unspecified without hematuria: Secondary | ICD-10-CM | POA: Diagnosis not present

## 2014-01-22 DIAGNOSIS — N401 Enlarged prostate with lower urinary tract symptoms: Secondary | ICD-10-CM | POA: Diagnosis not present

## 2014-01-22 DIAGNOSIS — N3091 Cystitis, unspecified with hematuria: Secondary | ICD-10-CM | POA: Diagnosis not present

## 2014-01-22 DIAGNOSIS — R351 Nocturia: Secondary | ICD-10-CM | POA: Diagnosis not present

## 2014-01-22 DIAGNOSIS — R309 Painful micturition, unspecified: Secondary | ICD-10-CM | POA: Diagnosis not present

## 2014-01-22 DIAGNOSIS — N318 Other neuromuscular dysfunction of bladder: Secondary | ICD-10-CM | POA: Diagnosis not present

## 2014-02-05 DIAGNOSIS — R351 Nocturia: Secondary | ICD-10-CM | POA: Diagnosis not present

## 2014-02-05 DIAGNOSIS — N401 Enlarged prostate with lower urinary tract symptoms: Secondary | ICD-10-CM | POA: Diagnosis not present

## 2014-02-05 DIAGNOSIS — R3915 Urgency of urination: Secondary | ICD-10-CM | POA: Diagnosis not present

## 2014-02-05 DIAGNOSIS — N318 Other neuromuscular dysfunction of bladder: Secondary | ICD-10-CM | POA: Diagnosis not present

## 2014-02-05 DIAGNOSIS — N3090 Cystitis, unspecified without hematuria: Secondary | ICD-10-CM | POA: Diagnosis not present

## 2014-02-14 DIAGNOSIS — E780 Pure hypercholesterolemia: Secondary | ICD-10-CM | POA: Diagnosis not present

## 2014-02-14 DIAGNOSIS — E8881 Metabolic syndrome: Secondary | ICD-10-CM | POA: Diagnosis not present

## 2014-02-14 DIAGNOSIS — Z720 Tobacco use: Secondary | ICD-10-CM | POA: Diagnosis not present

## 2014-02-14 DIAGNOSIS — E669 Obesity, unspecified: Secondary | ICD-10-CM | POA: Diagnosis not present

## 2014-02-14 DIAGNOSIS — Z23 Encounter for immunization: Secondary | ICD-10-CM | POA: Diagnosis not present

## 2014-02-14 DIAGNOSIS — N189 Chronic kidney disease, unspecified: Secondary | ICD-10-CM | POA: Diagnosis not present

## 2014-02-14 DIAGNOSIS — E663 Overweight: Secondary | ICD-10-CM | POA: Diagnosis not present

## 2014-02-14 DIAGNOSIS — I10 Essential (primary) hypertension: Secondary | ICD-10-CM | POA: Diagnosis not present

## 2014-03-04 DIAGNOSIS — J209 Acute bronchitis, unspecified: Secondary | ICD-10-CM | POA: Diagnosis not present

## 2014-03-04 DIAGNOSIS — R05 Cough: Secondary | ICD-10-CM | POA: Diagnosis not present

## 2014-03-04 DIAGNOSIS — J029 Acute pharyngitis, unspecified: Secondary | ICD-10-CM | POA: Diagnosis not present

## 2014-03-16 DIAGNOSIS — N3090 Cystitis, unspecified without hematuria: Secondary | ICD-10-CM | POA: Diagnosis not present

## 2014-03-16 DIAGNOSIS — R3915 Urgency of urination: Secondary | ICD-10-CM | POA: Diagnosis not present

## 2014-03-16 DIAGNOSIS — N401 Enlarged prostate with lower urinary tract symptoms: Secondary | ICD-10-CM | POA: Diagnosis not present

## 2014-03-16 DIAGNOSIS — N318 Other neuromuscular dysfunction of bladder: Secondary | ICD-10-CM | POA: Diagnosis not present

## 2014-03-16 DIAGNOSIS — N3281 Overactive bladder: Secondary | ICD-10-CM | POA: Diagnosis not present

## 2014-03-16 DIAGNOSIS — R351 Nocturia: Secondary | ICD-10-CM | POA: Diagnosis not present

## 2014-03-20 DIAGNOSIS — H6123 Impacted cerumen, bilateral: Secondary | ICD-10-CM | POA: Diagnosis not present

## 2014-05-05 DIAGNOSIS — R0602 Shortness of breath: Secondary | ICD-10-CM | POA: Diagnosis not present

## 2014-05-05 DIAGNOSIS — Z7982 Long term (current) use of aspirin: Secondary | ICD-10-CM | POA: Diagnosis not present

## 2014-05-05 DIAGNOSIS — N289 Disorder of kidney and ureter, unspecified: Secondary | ICD-10-CM | POA: Diagnosis not present

## 2014-05-05 DIAGNOSIS — I712 Thoracic aortic aneurysm, without rupture: Secondary | ICD-10-CM | POA: Diagnosis not present

## 2014-05-05 DIAGNOSIS — J449 Chronic obstructive pulmonary disease, unspecified: Secondary | ICD-10-CM | POA: Diagnosis not present

## 2014-05-05 DIAGNOSIS — J441 Chronic obstructive pulmonary disease with (acute) exacerbation: Secondary | ICD-10-CM | POA: Diagnosis not present

## 2014-05-05 DIAGNOSIS — J9809 Other diseases of bronchus, not elsewhere classified: Secondary | ICD-10-CM | POA: Diagnosis not present

## 2014-05-05 DIAGNOSIS — I10 Essential (primary) hypertension: Secondary | ICD-10-CM | POA: Diagnosis not present

## 2014-05-05 DIAGNOSIS — R0600 Dyspnea, unspecified: Secondary | ICD-10-CM | POA: Diagnosis not present

## 2014-05-05 DIAGNOSIS — J440 Chronic obstructive pulmonary disease with acute lower respiratory infection: Secondary | ICD-10-CM | POA: Diagnosis not present

## 2014-05-05 DIAGNOSIS — Z87891 Personal history of nicotine dependence: Secondary | ICD-10-CM | POA: Diagnosis not present

## 2014-05-05 DIAGNOSIS — R0689 Other abnormalities of breathing: Secondary | ICD-10-CM | POA: Diagnosis not present

## 2014-05-05 DIAGNOSIS — J189 Pneumonia, unspecified organism: Secondary | ICD-10-CM | POA: Diagnosis not present

## 2014-05-05 DIAGNOSIS — R062 Wheezing: Secondary | ICD-10-CM | POA: Diagnosis not present

## 2014-05-16 DIAGNOSIS — Z6829 Body mass index (BMI) 29.0-29.9, adult: Secondary | ICD-10-CM | POA: Diagnosis not present

## 2014-05-16 DIAGNOSIS — E669 Obesity, unspecified: Secondary | ICD-10-CM | POA: Diagnosis not present

## 2014-05-16 DIAGNOSIS — I10 Essential (primary) hypertension: Secondary | ICD-10-CM | POA: Diagnosis not present

## 2014-05-16 DIAGNOSIS — J209 Acute bronchitis, unspecified: Secondary | ICD-10-CM | POA: Diagnosis not present

## 2014-05-16 DIAGNOSIS — Z720 Tobacco use: Secondary | ICD-10-CM | POA: Diagnosis not present

## 2014-05-16 DIAGNOSIS — E8881 Metabolic syndrome: Secondary | ICD-10-CM | POA: Diagnosis not present

## 2014-05-16 DIAGNOSIS — E780 Pure hypercholesterolemia: Secondary | ICD-10-CM | POA: Diagnosis not present

## 2014-05-16 DIAGNOSIS — N189 Chronic kidney disease, unspecified: Secondary | ICD-10-CM | POA: Diagnosis not present

## 2014-05-18 DIAGNOSIS — Z87891 Personal history of nicotine dependence: Secondary | ICD-10-CM | POA: Diagnosis not present

## 2014-05-18 DIAGNOSIS — Z79899 Other long term (current) drug therapy: Secondary | ICD-10-CM | POA: Diagnosis not present

## 2014-05-18 DIAGNOSIS — N2589 Other disorders resulting from impaired renal tubular function: Secondary | ICD-10-CM | POA: Diagnosis not present

## 2014-05-18 DIAGNOSIS — N200 Calculus of kidney: Secondary | ICD-10-CM | POA: Diagnosis not present

## 2014-05-18 DIAGNOSIS — R351 Nocturia: Secondary | ICD-10-CM | POA: Diagnosis not present

## 2014-05-18 DIAGNOSIS — Z6830 Body mass index (BMI) 30.0-30.9, adult: Secondary | ICD-10-CM | POA: Diagnosis not present

## 2014-05-18 DIAGNOSIS — Z7982 Long term (current) use of aspirin: Secondary | ICD-10-CM | POA: Diagnosis not present

## 2014-05-18 DIAGNOSIS — I129 Hypertensive chronic kidney disease with stage 1 through stage 4 chronic kidney disease, or unspecified chronic kidney disease: Secondary | ICD-10-CM | POA: Diagnosis not present

## 2014-05-18 DIAGNOSIS — N183 Chronic kidney disease, stage 3 (moderate): Secondary | ICD-10-CM | POA: Diagnosis not present

## 2014-05-30 DIAGNOSIS — Z720 Tobacco use: Secondary | ICD-10-CM | POA: Diagnosis not present

## 2014-05-30 DIAGNOSIS — E8881 Metabolic syndrome: Secondary | ICD-10-CM | POA: Diagnosis not present

## 2014-05-30 DIAGNOSIS — N189 Chronic kidney disease, unspecified: Secondary | ICD-10-CM | POA: Diagnosis not present

## 2014-05-30 DIAGNOSIS — E780 Pure hypercholesterolemia: Secondary | ICD-10-CM | POA: Diagnosis not present

## 2014-05-30 DIAGNOSIS — E669 Obesity, unspecified: Secondary | ICD-10-CM | POA: Diagnosis not present

## 2014-05-30 DIAGNOSIS — I10 Essential (primary) hypertension: Secondary | ICD-10-CM | POA: Diagnosis not present

## 2014-05-30 DIAGNOSIS — Z6831 Body mass index (BMI) 31.0-31.9, adult: Secondary | ICD-10-CM | POA: Diagnosis not present

## 2014-08-22 DIAGNOSIS — I10 Essential (primary) hypertension: Secondary | ICD-10-CM | POA: Diagnosis not present

## 2014-08-22 DIAGNOSIS — Z720 Tobacco use: Secondary | ICD-10-CM | POA: Diagnosis not present

## 2014-08-22 DIAGNOSIS — N189 Chronic kidney disease, unspecified: Secondary | ICD-10-CM | POA: Diagnosis not present

## 2014-08-22 DIAGNOSIS — Z23 Encounter for immunization: Secondary | ICD-10-CM | POA: Diagnosis not present

## 2014-08-22 DIAGNOSIS — M25519 Pain in unspecified shoulder: Secondary | ICD-10-CM | POA: Diagnosis not present

## 2014-08-22 DIAGNOSIS — Z1389 Encounter for screening for other disorder: Secondary | ICD-10-CM | POA: Diagnosis not present

## 2014-08-22 DIAGNOSIS — Z6830 Body mass index (BMI) 30.0-30.9, adult: Secondary | ICD-10-CM | POA: Diagnosis not present

## 2014-08-22 DIAGNOSIS — E780 Pure hypercholesterolemia: Secondary | ICD-10-CM | POA: Diagnosis not present

## 2014-08-22 DIAGNOSIS — E8881 Metabolic syndrome: Secondary | ICD-10-CM | POA: Diagnosis not present

## 2014-08-29 DIAGNOSIS — E780 Pure hypercholesterolemia: Secondary | ICD-10-CM | POA: Diagnosis not present

## 2014-08-29 DIAGNOSIS — E669 Obesity, unspecified: Secondary | ICD-10-CM | POA: Diagnosis not present

## 2014-08-29 DIAGNOSIS — J209 Acute bronchitis, unspecified: Secondary | ICD-10-CM | POA: Diagnosis not present

## 2014-08-29 DIAGNOSIS — Z720 Tobacco use: Secondary | ICD-10-CM | POA: Diagnosis not present

## 2014-08-29 DIAGNOSIS — I10 Essential (primary) hypertension: Secondary | ICD-10-CM | POA: Diagnosis not present

## 2014-08-29 DIAGNOSIS — N189 Chronic kidney disease, unspecified: Secondary | ICD-10-CM | POA: Diagnosis not present

## 2014-08-29 DIAGNOSIS — Z6829 Body mass index (BMI) 29.0-29.9, adult: Secondary | ICD-10-CM | POA: Diagnosis not present

## 2014-08-29 DIAGNOSIS — E663 Overweight: Secondary | ICD-10-CM | POA: Diagnosis not present

## 2014-08-29 DIAGNOSIS — E8881 Metabolic syndrome: Secondary | ICD-10-CM | POA: Diagnosis not present

## 2014-10-07 DIAGNOSIS — M7020 Olecranon bursitis, unspecified elbow: Secondary | ICD-10-CM | POA: Diagnosis not present

## 2014-11-18 DIAGNOSIS — N183 Chronic kidney disease, stage 3 (moderate): Secondary | ICD-10-CM | POA: Diagnosis not present

## 2014-11-18 DIAGNOSIS — N2589 Other disorders resulting from impaired renal tubular function: Secondary | ICD-10-CM | POA: Diagnosis not present

## 2014-11-18 DIAGNOSIS — I129 Hypertensive chronic kidney disease with stage 1 through stage 4 chronic kidney disease, or unspecified chronic kidney disease: Secondary | ICD-10-CM | POA: Diagnosis not present

## 2014-11-22 DIAGNOSIS — M79645 Pain in left finger(s): Secondary | ICD-10-CM | POA: Diagnosis not present

## 2014-12-28 DIAGNOSIS — M65331 Trigger finger, right middle finger: Secondary | ICD-10-CM | POA: Diagnosis not present

## 2015-02-03 DIAGNOSIS — E669 Obesity, unspecified: Secondary | ICD-10-CM | POA: Diagnosis not present

## 2015-02-03 DIAGNOSIS — N189 Chronic kidney disease, unspecified: Secondary | ICD-10-CM | POA: Diagnosis not present

## 2015-02-03 DIAGNOSIS — F17200 Nicotine dependence, unspecified, uncomplicated: Secondary | ICD-10-CM | POA: Diagnosis not present

## 2015-02-03 DIAGNOSIS — Z9181 History of falling: Secondary | ICD-10-CM | POA: Diagnosis not present

## 2015-02-03 DIAGNOSIS — I10 Essential (primary) hypertension: Secondary | ICD-10-CM | POA: Diagnosis not present

## 2015-02-03 DIAGNOSIS — J209 Acute bronchitis, unspecified: Secondary | ICD-10-CM | POA: Diagnosis not present

## 2015-02-03 DIAGNOSIS — E7800 Pure hypercholesterolemia, unspecified: Secondary | ICD-10-CM | POA: Diagnosis not present

## 2015-02-03 DIAGNOSIS — Z6831 Body mass index (BMI) 31.0-31.9, adult: Secondary | ICD-10-CM | POA: Diagnosis not present

## 2015-02-03 DIAGNOSIS — M25519 Pain in unspecified shoulder: Secondary | ICD-10-CM | POA: Diagnosis not present

## 2015-02-03 DIAGNOSIS — E8881 Metabolic syndrome: Secondary | ICD-10-CM | POA: Diagnosis not present

## 2015-02-20 DIAGNOSIS — H9193 Unspecified hearing loss, bilateral: Secondary | ICD-10-CM | POA: Diagnosis not present

## 2015-02-20 DIAGNOSIS — E8881 Metabolic syndrome: Secondary | ICD-10-CM | POA: Diagnosis not present

## 2015-02-20 DIAGNOSIS — N189 Chronic kidney disease, unspecified: Secondary | ICD-10-CM | POA: Diagnosis not present

## 2015-02-20 DIAGNOSIS — E7800 Pure hypercholesterolemia, unspecified: Secondary | ICD-10-CM | POA: Diagnosis not present

## 2015-02-20 DIAGNOSIS — Z6830 Body mass index (BMI) 30.0-30.9, adult: Secondary | ICD-10-CM | POA: Diagnosis not present

## 2015-02-20 DIAGNOSIS — Z23 Encounter for immunization: Secondary | ICD-10-CM | POA: Diagnosis not present

## 2015-02-20 DIAGNOSIS — I10 Essential (primary) hypertension: Secondary | ICD-10-CM | POA: Diagnosis not present

## 2015-02-20 DIAGNOSIS — E669 Obesity, unspecified: Secondary | ICD-10-CM | POA: Diagnosis not present

## 2015-02-20 DIAGNOSIS — F17200 Nicotine dependence, unspecified, uncomplicated: Secondary | ICD-10-CM | POA: Diagnosis not present

## 2015-02-20 DIAGNOSIS — Z0000 Encounter for general adult medical examination without abnormal findings: Secondary | ICD-10-CM | POA: Diagnosis not present

## 2015-04-07 DIAGNOSIS — J209 Acute bronchitis, unspecified: Secondary | ICD-10-CM | POA: Diagnosis not present

## 2015-05-11 DIAGNOSIS — J029 Acute pharyngitis, unspecified: Secondary | ICD-10-CM | POA: Diagnosis not present

## 2015-05-24 DIAGNOSIS — N200 Calculus of kidney: Secondary | ICD-10-CM | POA: Diagnosis not present

## 2015-05-24 DIAGNOSIS — N183 Chronic kidney disease, stage 3 (moderate): Secondary | ICD-10-CM | POA: Diagnosis not present

## 2015-05-24 DIAGNOSIS — Z79899 Other long term (current) drug therapy: Secondary | ICD-10-CM | POA: Diagnosis not present

## 2015-05-24 DIAGNOSIS — G4730 Sleep apnea, unspecified: Secondary | ICD-10-CM | POA: Diagnosis not present

## 2015-05-24 DIAGNOSIS — Z7982 Long term (current) use of aspirin: Secondary | ICD-10-CM | POA: Diagnosis not present

## 2015-05-24 DIAGNOSIS — R351 Nocturia: Secondary | ICD-10-CM | POA: Diagnosis not present

## 2015-05-24 DIAGNOSIS — M810 Age-related osteoporosis without current pathological fracture: Secondary | ICD-10-CM | POA: Diagnosis not present

## 2015-05-24 DIAGNOSIS — N2589 Other disorders resulting from impaired renal tubular function: Secondary | ICD-10-CM | POA: Diagnosis not present

## 2015-05-24 DIAGNOSIS — I129 Hypertensive chronic kidney disease with stage 1 through stage 4 chronic kidney disease, or unspecified chronic kidney disease: Secondary | ICD-10-CM | POA: Diagnosis not present

## 2015-05-29 DIAGNOSIS — N189 Chronic kidney disease, unspecified: Secondary | ICD-10-CM | POA: Diagnosis not present

## 2015-05-29 DIAGNOSIS — E669 Obesity, unspecified: Secondary | ICD-10-CM | POA: Diagnosis not present

## 2015-05-29 DIAGNOSIS — E663 Overweight: Secondary | ICD-10-CM | POA: Diagnosis not present

## 2015-05-29 DIAGNOSIS — E8881 Metabolic syndrome: Secondary | ICD-10-CM | POA: Diagnosis not present

## 2015-05-29 DIAGNOSIS — E7800 Pure hypercholesterolemia, unspecified: Secondary | ICD-10-CM | POA: Diagnosis not present

## 2015-05-29 DIAGNOSIS — F17200 Nicotine dependence, unspecified, uncomplicated: Secondary | ICD-10-CM | POA: Diagnosis not present

## 2015-05-29 DIAGNOSIS — I10 Essential (primary) hypertension: Secondary | ICD-10-CM | POA: Diagnosis not present

## 2015-05-29 DIAGNOSIS — Z6832 Body mass index (BMI) 32.0-32.9, adult: Secondary | ICD-10-CM | POA: Diagnosis not present

## 2015-05-29 DIAGNOSIS — H9193 Unspecified hearing loss, bilateral: Secondary | ICD-10-CM | POA: Diagnosis not present

## 2015-07-02 DIAGNOSIS — I10 Essential (primary) hypertension: Secondary | ICD-10-CM | POA: Diagnosis not present

## 2015-07-02 DIAGNOSIS — E7800 Pure hypercholesterolemia, unspecified: Secondary | ICD-10-CM | POA: Diagnosis not present

## 2015-07-02 DIAGNOSIS — H9193 Unspecified hearing loss, bilateral: Secondary | ICD-10-CM | POA: Diagnosis not present

## 2015-07-02 DIAGNOSIS — F17200 Nicotine dependence, unspecified, uncomplicated: Secondary | ICD-10-CM | POA: Diagnosis not present

## 2015-07-02 DIAGNOSIS — Z6831 Body mass index (BMI) 31.0-31.9, adult: Secondary | ICD-10-CM | POA: Diagnosis not present

## 2015-07-02 DIAGNOSIS — E8881 Metabolic syndrome: Secondary | ICD-10-CM | POA: Diagnosis not present

## 2015-07-02 DIAGNOSIS — I471 Supraventricular tachycardia: Secondary | ICD-10-CM | POA: Diagnosis not present

## 2015-07-02 DIAGNOSIS — N189 Chronic kidney disease, unspecified: Secondary | ICD-10-CM | POA: Diagnosis not present

## 2015-07-02 DIAGNOSIS — E669 Obesity, unspecified: Secondary | ICD-10-CM | POA: Diagnosis not present

## 2015-08-23 DIAGNOSIS — R197 Diarrhea, unspecified: Secondary | ICD-10-CM | POA: Diagnosis not present

## 2015-08-23 DIAGNOSIS — E8881 Metabolic syndrome: Secondary | ICD-10-CM | POA: Diagnosis not present

## 2015-08-23 DIAGNOSIS — I471 Supraventricular tachycardia: Secondary | ICD-10-CM | POA: Diagnosis not present

## 2015-08-23 DIAGNOSIS — E669 Obesity, unspecified: Secondary | ICD-10-CM | POA: Diagnosis not present

## 2015-08-23 DIAGNOSIS — E7800 Pure hypercholesterolemia, unspecified: Secondary | ICD-10-CM | POA: Diagnosis not present

## 2015-08-23 DIAGNOSIS — F17200 Nicotine dependence, unspecified, uncomplicated: Secondary | ICD-10-CM | POA: Diagnosis not present

## 2015-08-23 DIAGNOSIS — N189 Chronic kidney disease, unspecified: Secondary | ICD-10-CM | POA: Diagnosis not present

## 2015-08-23 DIAGNOSIS — H9193 Unspecified hearing loss, bilateral: Secondary | ICD-10-CM | POA: Diagnosis not present

## 2015-08-23 DIAGNOSIS — I10 Essential (primary) hypertension: Secondary | ICD-10-CM | POA: Diagnosis not present

## 2015-08-23 DIAGNOSIS — Z6831 Body mass index (BMI) 31.0-31.9, adult: Secondary | ICD-10-CM | POA: Diagnosis not present

## 2015-08-24 DIAGNOSIS — R197 Diarrhea, unspecified: Secondary | ICD-10-CM | POA: Diagnosis not present

## 2015-08-28 DIAGNOSIS — E8881 Metabolic syndrome: Secondary | ICD-10-CM | POA: Diagnosis not present

## 2015-08-28 DIAGNOSIS — N189 Chronic kidney disease, unspecified: Secondary | ICD-10-CM | POA: Diagnosis not present

## 2015-08-28 DIAGNOSIS — I10 Essential (primary) hypertension: Secondary | ICD-10-CM | POA: Diagnosis not present

## 2015-08-28 DIAGNOSIS — Z6832 Body mass index (BMI) 32.0-32.9, adult: Secondary | ICD-10-CM | POA: Diagnosis not present

## 2015-08-28 DIAGNOSIS — H9193 Unspecified hearing loss, bilateral: Secondary | ICD-10-CM | POA: Diagnosis not present

## 2015-08-28 DIAGNOSIS — F17200 Nicotine dependence, unspecified, uncomplicated: Secondary | ICD-10-CM | POA: Diagnosis not present

## 2015-08-28 DIAGNOSIS — E7800 Pure hypercholesterolemia, unspecified: Secondary | ICD-10-CM | POA: Diagnosis not present

## 2015-08-28 DIAGNOSIS — I471 Supraventricular tachycardia: Secondary | ICD-10-CM | POA: Diagnosis not present

## 2015-08-28 DIAGNOSIS — E669 Obesity, unspecified: Secondary | ICD-10-CM | POA: Diagnosis not present

## 2015-09-14 DIAGNOSIS — K625 Hemorrhage of anus and rectum: Secondary | ICD-10-CM | POA: Diagnosis not present

## 2015-10-28 DIAGNOSIS — N318 Other neuromuscular dysfunction of bladder: Secondary | ICD-10-CM | POA: Diagnosis not present

## 2015-10-28 DIAGNOSIS — N401 Enlarged prostate with lower urinary tract symptoms: Secondary | ICD-10-CM | POA: Diagnosis not present

## 2015-10-28 DIAGNOSIS — R3915 Urgency of urination: Secondary | ICD-10-CM | POA: Diagnosis not present

## 2015-10-28 DIAGNOSIS — R351 Nocturia: Secondary | ICD-10-CM | POA: Diagnosis not present

## 2015-10-28 DIAGNOSIS — N3020 Other chronic cystitis without hematuria: Secondary | ICD-10-CM | POA: Diagnosis not present

## 2015-10-28 DIAGNOSIS — N3281 Overactive bladder: Secondary | ICD-10-CM | POA: Diagnosis not present

## 2015-11-26 DIAGNOSIS — N2589 Other disorders resulting from impaired renal tubular function: Secondary | ICD-10-CM | POA: Diagnosis not present

## 2015-11-26 DIAGNOSIS — Z7982 Long term (current) use of aspirin: Secondary | ICD-10-CM | POA: Diagnosis not present

## 2015-11-26 DIAGNOSIS — R351 Nocturia: Secondary | ICD-10-CM | POA: Diagnosis not present

## 2015-11-26 DIAGNOSIS — N29 Other disorders of kidney and ureter in diseases classified elsewhere: Secondary | ICD-10-CM | POA: Diagnosis not present

## 2015-11-26 DIAGNOSIS — E785 Hyperlipidemia, unspecified: Secondary | ICD-10-CM | POA: Diagnosis not present

## 2015-11-26 DIAGNOSIS — G4730 Sleep apnea, unspecified: Secondary | ICD-10-CM | POA: Diagnosis not present

## 2015-11-26 DIAGNOSIS — I129 Hypertensive chronic kidney disease with stage 1 through stage 4 chronic kidney disease, or unspecified chronic kidney disease: Secondary | ICD-10-CM | POA: Diagnosis not present

## 2015-11-26 DIAGNOSIS — N183 Chronic kidney disease, stage 3 (moderate): Secondary | ICD-10-CM | POA: Diagnosis not present

## 2015-11-26 DIAGNOSIS — R0609 Other forms of dyspnea: Secondary | ICD-10-CM | POA: Diagnosis not present

## 2015-11-26 DIAGNOSIS — M810 Age-related osteoporosis without current pathological fracture: Secondary | ICD-10-CM | POA: Diagnosis not present

## 2015-11-26 DIAGNOSIS — N200 Calculus of kidney: Secondary | ICD-10-CM | POA: Diagnosis not present

## 2015-11-26 DIAGNOSIS — Z79899 Other long term (current) drug therapy: Secondary | ICD-10-CM | POA: Diagnosis not present

## 2015-11-27 DIAGNOSIS — I471 Supraventricular tachycardia: Secondary | ICD-10-CM | POA: Diagnosis not present

## 2015-11-27 DIAGNOSIS — Z6831 Body mass index (BMI) 31.0-31.9, adult: Secondary | ICD-10-CM | POA: Diagnosis not present

## 2015-11-27 DIAGNOSIS — E7800 Pure hypercholesterolemia, unspecified: Secondary | ICD-10-CM | POA: Diagnosis not present

## 2015-11-27 DIAGNOSIS — N189 Chronic kidney disease, unspecified: Secondary | ICD-10-CM | POA: Diagnosis not present

## 2015-11-27 DIAGNOSIS — Z9181 History of falling: Secondary | ICD-10-CM | POA: Diagnosis not present

## 2015-11-27 DIAGNOSIS — H9193 Unspecified hearing loss, bilateral: Secondary | ICD-10-CM | POA: Diagnosis not present

## 2015-11-27 DIAGNOSIS — E8881 Metabolic syndrome: Secondary | ICD-10-CM | POA: Diagnosis not present

## 2015-11-27 DIAGNOSIS — F17200 Nicotine dependence, unspecified, uncomplicated: Secondary | ICD-10-CM | POA: Diagnosis not present

## 2015-11-27 DIAGNOSIS — R0609 Other forms of dyspnea: Secondary | ICD-10-CM | POA: Diagnosis not present

## 2015-11-27 DIAGNOSIS — I10 Essential (primary) hypertension: Secondary | ICD-10-CM | POA: Diagnosis not present

## 2015-11-27 DIAGNOSIS — Z1389 Encounter for screening for other disorder: Secondary | ICD-10-CM | POA: Diagnosis not present

## 2015-11-27 DIAGNOSIS — E669 Obesity, unspecified: Secondary | ICD-10-CM | POA: Diagnosis not present

## 2015-12-02 DIAGNOSIS — R0609 Other forms of dyspnea: Secondary | ICD-10-CM | POA: Diagnosis not present

## 2015-12-02 DIAGNOSIS — N189 Chronic kidney disease, unspecified: Secondary | ICD-10-CM | POA: Diagnosis not present

## 2015-12-02 DIAGNOSIS — I10 Essential (primary) hypertension: Secondary | ICD-10-CM | POA: Diagnosis not present

## 2015-12-09 DIAGNOSIS — N185 Chronic kidney disease, stage 5: Secondary | ICD-10-CM | POA: Diagnosis not present

## 2015-12-23 DIAGNOSIS — I10 Essential (primary) hypertension: Secondary | ICD-10-CM | POA: Diagnosis not present

## 2015-12-23 DIAGNOSIS — N185 Chronic kidney disease, stage 5: Secondary | ICD-10-CM | POA: Diagnosis not present

## 2015-12-23 DIAGNOSIS — R0609 Other forms of dyspnea: Secondary | ICD-10-CM | POA: Diagnosis not present

## 2015-12-30 DIAGNOSIS — N185 Chronic kidney disease, stage 5: Secondary | ICD-10-CM | POA: Diagnosis not present

## 2016-02-04 DIAGNOSIS — R05 Cough: Secondary | ICD-10-CM | POA: Diagnosis not present

## 2016-02-11 DIAGNOSIS — H6123 Impacted cerumen, bilateral: Secondary | ICD-10-CM | POA: Diagnosis not present

## 2016-02-26 DIAGNOSIS — E669 Obesity, unspecified: Secondary | ICD-10-CM | POA: Diagnosis not present

## 2016-02-26 DIAGNOSIS — Z23 Encounter for immunization: Secondary | ICD-10-CM | POA: Diagnosis not present

## 2016-02-26 DIAGNOSIS — E7800 Pure hypercholesterolemia, unspecified: Secondary | ICD-10-CM | POA: Diagnosis not present

## 2016-02-26 DIAGNOSIS — Z6832 Body mass index (BMI) 32.0-32.9, adult: Secondary | ICD-10-CM | POA: Diagnosis not present

## 2016-02-26 DIAGNOSIS — F17200 Nicotine dependence, unspecified, uncomplicated: Secondary | ICD-10-CM | POA: Diagnosis not present

## 2016-02-26 DIAGNOSIS — E8881 Metabolic syndrome: Secondary | ICD-10-CM | POA: Diagnosis not present

## 2016-02-26 DIAGNOSIS — I10 Essential (primary) hypertension: Secondary | ICD-10-CM | POA: Diagnosis not present

## 2016-02-26 DIAGNOSIS — I471 Supraventricular tachycardia: Secondary | ICD-10-CM | POA: Diagnosis not present

## 2016-02-26 DIAGNOSIS — H9193 Unspecified hearing loss, bilateral: Secondary | ICD-10-CM | POA: Diagnosis not present

## 2016-02-26 DIAGNOSIS — J449 Chronic obstructive pulmonary disease, unspecified: Secondary | ICD-10-CM | POA: Diagnosis not present

## 2016-02-26 DIAGNOSIS — N189 Chronic kidney disease, unspecified: Secondary | ICD-10-CM | POA: Diagnosis not present

## 2016-03-31 DIAGNOSIS — H903 Sensorineural hearing loss, bilateral: Secondary | ICD-10-CM | POA: Diagnosis not present

## 2016-04-11 DIAGNOSIS — J069 Acute upper respiratory infection, unspecified: Secondary | ICD-10-CM | POA: Diagnosis not present

## 2016-04-18 DIAGNOSIS — H903 Sensorineural hearing loss, bilateral: Secondary | ICD-10-CM | POA: Diagnosis not present

## 2016-04-18 DIAGNOSIS — M65331 Trigger finger, right middle finger: Secondary | ICD-10-CM | POA: Diagnosis not present

## 2016-05-05 DIAGNOSIS — I10 Essential (primary) hypertension: Secondary | ICD-10-CM | POA: Diagnosis not present

## 2016-05-05 DIAGNOSIS — M1990 Unspecified osteoarthritis, unspecified site: Secondary | ICD-10-CM | POA: Diagnosis not present

## 2016-05-05 DIAGNOSIS — Z87891 Personal history of nicotine dependence: Secondary | ICD-10-CM | POA: Diagnosis not present

## 2016-05-05 DIAGNOSIS — Z79899 Other long term (current) drug therapy: Secondary | ICD-10-CM | POA: Diagnosis not present

## 2016-05-05 DIAGNOSIS — M65331 Trigger finger, right middle finger: Secondary | ICD-10-CM | POA: Diagnosis not present

## 2016-05-05 DIAGNOSIS — Z7982 Long term (current) use of aspirin: Secondary | ICD-10-CM | POA: Diagnosis not present

## 2016-05-05 DIAGNOSIS — Z880 Allergy status to penicillin: Secondary | ICD-10-CM | POA: Diagnosis not present

## 2016-05-05 DIAGNOSIS — Z882 Allergy status to sulfonamides status: Secondary | ICD-10-CM | POA: Diagnosis not present

## 2016-05-13 DIAGNOSIS — I10 Essential (primary) hypertension: Secondary | ICD-10-CM | POA: Diagnosis not present

## 2016-05-13 DIAGNOSIS — H9193 Unspecified hearing loss, bilateral: Secondary | ICD-10-CM | POA: Diagnosis not present

## 2016-05-13 DIAGNOSIS — J449 Chronic obstructive pulmonary disease, unspecified: Secondary | ICD-10-CM | POA: Diagnosis not present

## 2016-05-13 DIAGNOSIS — I471 Supraventricular tachycardia: Secondary | ICD-10-CM | POA: Diagnosis not present

## 2016-05-13 DIAGNOSIS — E669 Obesity, unspecified: Secondary | ICD-10-CM | POA: Diagnosis not present

## 2016-05-13 DIAGNOSIS — Z6833 Body mass index (BMI) 33.0-33.9, adult: Secondary | ICD-10-CM | POA: Diagnosis not present

## 2016-05-13 DIAGNOSIS — F17200 Nicotine dependence, unspecified, uncomplicated: Secondary | ICD-10-CM | POA: Diagnosis not present

## 2016-05-13 DIAGNOSIS — E7800 Pure hypercholesterolemia, unspecified: Secondary | ICD-10-CM | POA: Diagnosis not present

## 2016-05-13 DIAGNOSIS — E8881 Metabolic syndrome: Secondary | ICD-10-CM | POA: Diagnosis not present

## 2016-05-13 DIAGNOSIS — N189 Chronic kidney disease, unspecified: Secondary | ICD-10-CM | POA: Diagnosis not present

## 2016-05-15 DIAGNOSIS — M65331 Trigger finger, right middle finger: Secondary | ICD-10-CM | POA: Diagnosis not present

## 2016-05-24 DIAGNOSIS — G4730 Sleep apnea, unspecified: Secondary | ICD-10-CM | POA: Diagnosis not present

## 2016-05-24 DIAGNOSIS — N2589 Other disorders resulting from impaired renal tubular function: Secondary | ICD-10-CM | POA: Diagnosis not present

## 2016-05-24 DIAGNOSIS — I129 Hypertensive chronic kidney disease with stage 1 through stage 4 chronic kidney disease, or unspecified chronic kidney disease: Secondary | ICD-10-CM | POA: Diagnosis not present

## 2016-05-24 DIAGNOSIS — Z79899 Other long term (current) drug therapy: Secondary | ICD-10-CM | POA: Diagnosis not present

## 2016-05-24 DIAGNOSIS — R351 Nocturia: Secondary | ICD-10-CM | POA: Diagnosis not present

## 2016-05-24 DIAGNOSIS — N183 Chronic kidney disease, stage 3 (moderate): Secondary | ICD-10-CM | POA: Diagnosis not present

## 2016-05-24 DIAGNOSIS — Z7982 Long term (current) use of aspirin: Secondary | ICD-10-CM | POA: Diagnosis not present

## 2016-05-24 DIAGNOSIS — H903 Sensorineural hearing loss, bilateral: Secondary | ICD-10-CM | POA: Diagnosis not present

## 2016-05-24 DIAGNOSIS — E785 Hyperlipidemia, unspecified: Secondary | ICD-10-CM | POA: Diagnosis not present

## 2016-05-24 DIAGNOSIS — I1310 Hypertensive heart and chronic kidney disease without heart failure, with stage 1 through stage 4 chronic kidney disease, or unspecified chronic kidney disease: Secondary | ICD-10-CM | POA: Diagnosis not present

## 2016-05-24 DIAGNOSIS — Z87891 Personal history of nicotine dependence: Secondary | ICD-10-CM | POA: Diagnosis not present

## 2016-05-24 DIAGNOSIS — N200 Calculus of kidney: Secondary | ICD-10-CM | POA: Diagnosis not present

## 2016-05-24 DIAGNOSIS — M810 Age-related osteoporosis without current pathological fracture: Secondary | ICD-10-CM | POA: Diagnosis not present

## 2016-06-17 DIAGNOSIS — E7800 Pure hypercholesterolemia, unspecified: Secondary | ICD-10-CM | POA: Diagnosis not present

## 2016-06-17 DIAGNOSIS — I471 Supraventricular tachycardia: Secondary | ICD-10-CM | POA: Diagnosis not present

## 2016-06-17 DIAGNOSIS — E8881 Metabolic syndrome: Secondary | ICD-10-CM | POA: Diagnosis not present

## 2016-06-17 DIAGNOSIS — J449 Chronic obstructive pulmonary disease, unspecified: Secondary | ICD-10-CM | POA: Diagnosis not present

## 2016-06-17 DIAGNOSIS — Z0000 Encounter for general adult medical examination without abnormal findings: Secondary | ICD-10-CM | POA: Diagnosis not present

## 2016-06-17 DIAGNOSIS — N189 Chronic kidney disease, unspecified: Secondary | ICD-10-CM | POA: Diagnosis not present

## 2016-06-17 DIAGNOSIS — Z6833 Body mass index (BMI) 33.0-33.9, adult: Secondary | ICD-10-CM | POA: Diagnosis not present

## 2016-06-17 DIAGNOSIS — H9193 Unspecified hearing loss, bilateral: Secondary | ICD-10-CM | POA: Diagnosis not present

## 2016-06-17 DIAGNOSIS — Z125 Encounter for screening for malignant neoplasm of prostate: Secondary | ICD-10-CM | POA: Diagnosis not present

## 2016-06-17 DIAGNOSIS — I10 Essential (primary) hypertension: Secondary | ICD-10-CM | POA: Diagnosis not present

## 2016-06-17 DIAGNOSIS — E669 Obesity, unspecified: Secondary | ICD-10-CM | POA: Diagnosis not present

## 2016-06-17 DIAGNOSIS — F17200 Nicotine dependence, unspecified, uncomplicated: Secondary | ICD-10-CM | POA: Diagnosis not present

## 2016-10-20 DIAGNOSIS — S233XXA Sprain of ligaments of thoracic spine, initial encounter: Secondary | ICD-10-CM | POA: Diagnosis not present

## 2016-11-13 DIAGNOSIS — J209 Acute bronchitis, unspecified: Secondary | ICD-10-CM | POA: Diagnosis not present

## 2016-11-22 DIAGNOSIS — I129 Hypertensive chronic kidney disease with stage 1 through stage 4 chronic kidney disease, or unspecified chronic kidney disease: Secondary | ICD-10-CM | POA: Diagnosis not present

## 2016-11-22 DIAGNOSIS — N2589 Other disorders resulting from impaired renal tubular function: Secondary | ICD-10-CM | POA: Diagnosis not present

## 2016-11-22 DIAGNOSIS — Z79899 Other long term (current) drug therapy: Secondary | ICD-10-CM | POA: Diagnosis not present

## 2016-11-22 DIAGNOSIS — N183 Chronic kidney disease, stage 3 (moderate): Secondary | ICD-10-CM | POA: Diagnosis not present

## 2016-11-22 DIAGNOSIS — R0600 Dyspnea, unspecified: Secondary | ICD-10-CM | POA: Diagnosis not present

## 2016-11-22 DIAGNOSIS — Z9079 Acquired absence of other genital organ(s): Secondary | ICD-10-CM | POA: Diagnosis not present

## 2016-11-22 DIAGNOSIS — E669 Obesity, unspecified: Secondary | ICD-10-CM | POA: Diagnosis not present

## 2016-11-22 DIAGNOSIS — N29 Other disorders of kidney and ureter in diseases classified elsewhere: Secondary | ICD-10-CM | POA: Diagnosis not present

## 2016-11-22 DIAGNOSIS — R0609 Other forms of dyspnea: Secondary | ICD-10-CM | POA: Diagnosis not present

## 2016-11-22 DIAGNOSIS — R351 Nocturia: Secondary | ICD-10-CM | POA: Diagnosis not present

## 2016-11-25 DIAGNOSIS — N189 Chronic kidney disease, unspecified: Secondary | ICD-10-CM | POA: Diagnosis not present

## 2016-11-25 DIAGNOSIS — E7800 Pure hypercholesterolemia, unspecified: Secondary | ICD-10-CM | POA: Diagnosis not present

## 2016-11-25 DIAGNOSIS — F17200 Nicotine dependence, unspecified, uncomplicated: Secondary | ICD-10-CM | POA: Diagnosis not present

## 2016-11-25 DIAGNOSIS — Z6833 Body mass index (BMI) 33.0-33.9, adult: Secondary | ICD-10-CM | POA: Diagnosis not present

## 2016-11-25 DIAGNOSIS — E8881 Metabolic syndrome: Secondary | ICD-10-CM | POA: Diagnosis not present

## 2016-11-25 DIAGNOSIS — H9193 Unspecified hearing loss, bilateral: Secondary | ICD-10-CM | POA: Diagnosis not present

## 2016-11-25 DIAGNOSIS — I10 Essential (primary) hypertension: Secondary | ICD-10-CM | POA: Diagnosis not present

## 2016-11-25 DIAGNOSIS — J449 Chronic obstructive pulmonary disease, unspecified: Secondary | ICD-10-CM | POA: Diagnosis not present

## 2016-11-25 DIAGNOSIS — E669 Obesity, unspecified: Secondary | ICD-10-CM | POA: Diagnosis not present

## 2016-11-25 DIAGNOSIS — I471 Supraventricular tachycardia: Secondary | ICD-10-CM | POA: Diagnosis not present

## 2017-02-01 DIAGNOSIS — J209 Acute bronchitis, unspecified: Secondary | ICD-10-CM | POA: Diagnosis not present

## 2017-02-01 DIAGNOSIS — J0100 Acute maxillary sinusitis, unspecified: Secondary | ICD-10-CM | POA: Diagnosis not present

## 2017-03-10 DIAGNOSIS — F17200 Nicotine dependence, unspecified, uncomplicated: Secondary | ICD-10-CM | POA: Diagnosis not present

## 2017-03-10 DIAGNOSIS — E8881 Metabolic syndrome: Secondary | ICD-10-CM | POA: Diagnosis not present

## 2017-03-10 DIAGNOSIS — E7800 Pure hypercholesterolemia, unspecified: Secondary | ICD-10-CM | POA: Diagnosis not present

## 2017-03-10 DIAGNOSIS — N189 Chronic kidney disease, unspecified: Secondary | ICD-10-CM | POA: Diagnosis not present

## 2017-03-10 DIAGNOSIS — I471 Supraventricular tachycardia: Secondary | ICD-10-CM | POA: Diagnosis not present

## 2017-03-10 DIAGNOSIS — Z6833 Body mass index (BMI) 33.0-33.9, adult: Secondary | ICD-10-CM | POA: Diagnosis not present

## 2017-03-10 DIAGNOSIS — H9193 Unspecified hearing loss, bilateral: Secondary | ICD-10-CM | POA: Diagnosis not present

## 2017-03-10 DIAGNOSIS — J449 Chronic obstructive pulmonary disease, unspecified: Secondary | ICD-10-CM | POA: Diagnosis not present

## 2017-03-10 DIAGNOSIS — E669 Obesity, unspecified: Secondary | ICD-10-CM | POA: Diagnosis not present

## 2017-03-10 DIAGNOSIS — Z23 Encounter for immunization: Secondary | ICD-10-CM | POA: Diagnosis not present

## 2017-03-10 DIAGNOSIS — I10 Essential (primary) hypertension: Secondary | ICD-10-CM | POA: Diagnosis not present

## 2017-04-19 DIAGNOSIS — H6123 Impacted cerumen, bilateral: Secondary | ICD-10-CM | POA: Diagnosis not present

## 2017-05-11 DIAGNOSIS — Z125 Encounter for screening for malignant neoplasm of prostate: Secondary | ICD-10-CM | POA: Diagnosis not present

## 2017-05-11 DIAGNOSIS — E785 Hyperlipidemia, unspecified: Secondary | ICD-10-CM | POA: Diagnosis not present

## 2017-05-11 DIAGNOSIS — Z1211 Encounter for screening for malignant neoplasm of colon: Secondary | ICD-10-CM | POA: Diagnosis not present

## 2017-05-11 DIAGNOSIS — Z9181 History of falling: Secondary | ICD-10-CM | POA: Diagnosis not present

## 2017-05-11 DIAGNOSIS — I471 Supraventricular tachycardia: Secondary | ICD-10-CM | POA: Diagnosis not present

## 2017-05-11 DIAGNOSIS — J449 Chronic obstructive pulmonary disease, unspecified: Secondary | ICD-10-CM | POA: Diagnosis not present

## 2017-05-11 DIAGNOSIS — Z23 Encounter for immunization: Secondary | ICD-10-CM | POA: Diagnosis not present

## 2017-05-11 DIAGNOSIS — E7800 Pure hypercholesterolemia, unspecified: Secondary | ICD-10-CM | POA: Diagnosis not present

## 2017-05-11 DIAGNOSIS — Z6831 Body mass index (BMI) 31.0-31.9, adult: Secondary | ICD-10-CM | POA: Diagnosis not present

## 2017-05-11 DIAGNOSIS — I10 Essential (primary) hypertension: Secondary | ICD-10-CM | POA: Diagnosis not present

## 2017-05-11 DIAGNOSIS — Z0000 Encounter for general adult medical examination without abnormal findings: Secondary | ICD-10-CM | POA: Diagnosis not present

## 2017-05-16 DIAGNOSIS — R351 Nocturia: Secondary | ICD-10-CM | POA: Diagnosis not present

## 2017-05-16 DIAGNOSIS — E785 Hyperlipidemia, unspecified: Secondary | ICD-10-CM | POA: Diagnosis not present

## 2017-05-16 DIAGNOSIS — N2589 Other disorders resulting from impaired renal tubular function: Secondary | ICD-10-CM | POA: Diagnosis not present

## 2017-05-16 DIAGNOSIS — N183 Chronic kidney disease, stage 3 (moderate): Secondary | ICD-10-CM | POA: Diagnosis not present

## 2017-05-16 DIAGNOSIS — E669 Obesity, unspecified: Secondary | ICD-10-CM | POA: Diagnosis not present

## 2017-05-16 DIAGNOSIS — Z9079 Acquired absence of other genital organ(s): Secondary | ICD-10-CM | POA: Diagnosis not present

## 2017-05-16 DIAGNOSIS — I129 Hypertensive chronic kidney disease with stage 1 through stage 4 chronic kidney disease, or unspecified chronic kidney disease: Secondary | ICD-10-CM | POA: Diagnosis not present

## 2017-05-23 DIAGNOSIS — H2513 Age-related nuclear cataract, bilateral: Secondary | ICD-10-CM | POA: Diagnosis not present

## 2017-06-19 DIAGNOSIS — S20219A Contusion of unspecified front wall of thorax, initial encounter: Secondary | ICD-10-CM | POA: Diagnosis not present

## 2017-06-19 DIAGNOSIS — S29011A Strain of muscle and tendon of front wall of thorax, initial encounter: Secondary | ICD-10-CM | POA: Diagnosis not present

## 2017-06-29 DIAGNOSIS — S20219A Contusion of unspecified front wall of thorax, initial encounter: Secondary | ICD-10-CM | POA: Diagnosis not present

## 2017-07-20 ENCOUNTER — Encounter: Payer: Self-pay | Admitting: Gastroenterology

## 2017-08-11 DIAGNOSIS — I10 Essential (primary) hypertension: Secondary | ICD-10-CM | POA: Diagnosis not present

## 2017-08-11 DIAGNOSIS — E7800 Pure hypercholesterolemia, unspecified: Secondary | ICD-10-CM | POA: Diagnosis not present

## 2017-08-11 DIAGNOSIS — I471 Supraventricular tachycardia: Secondary | ICD-10-CM | POA: Diagnosis not present

## 2017-08-11 DIAGNOSIS — J449 Chronic obstructive pulmonary disease, unspecified: Secondary | ICD-10-CM | POA: Diagnosis not present

## 2017-09-10 DIAGNOSIS — D125 Benign neoplasm of sigmoid colon: Secondary | ICD-10-CM | POA: Diagnosis not present

## 2017-09-10 DIAGNOSIS — Z1211 Encounter for screening for malignant neoplasm of colon: Secondary | ICD-10-CM | POA: Diagnosis not present

## 2017-09-10 DIAGNOSIS — D126 Benign neoplasm of colon, unspecified: Secondary | ICD-10-CM | POA: Diagnosis not present

## 2017-09-10 DIAGNOSIS — Z86010 Personal history of colonic polyps: Secondary | ICD-10-CM | POA: Diagnosis not present

## 2017-11-05 DIAGNOSIS — I10 Essential (primary) hypertension: Secondary | ICD-10-CM | POA: Diagnosis not present

## 2017-11-05 DIAGNOSIS — N183 Chronic kidney disease, stage 3 (moderate): Secondary | ICD-10-CM | POA: Diagnosis not present

## 2017-11-05 DIAGNOSIS — E669 Obesity, unspecified: Secondary | ICD-10-CM | POA: Diagnosis not present

## 2017-11-05 DIAGNOSIS — Z9079 Acquired absence of other genital organ(s): Secondary | ICD-10-CM | POA: Diagnosis not present

## 2017-11-05 DIAGNOSIS — G4730 Sleep apnea, unspecified: Secondary | ICD-10-CM | POA: Diagnosis not present

## 2017-11-05 DIAGNOSIS — I129 Hypertensive chronic kidney disease with stage 1 through stage 4 chronic kidney disease, or unspecified chronic kidney disease: Secondary | ICD-10-CM | POA: Diagnosis not present

## 2017-11-05 DIAGNOSIS — Z6830 Body mass index (BMI) 30.0-30.9, adult: Secondary | ICD-10-CM | POA: Diagnosis not present

## 2017-11-05 DIAGNOSIS — E785 Hyperlipidemia, unspecified: Secondary | ICD-10-CM | POA: Diagnosis not present

## 2017-11-05 DIAGNOSIS — N29 Other disorders of kidney and ureter in diseases classified elsewhere: Secondary | ICD-10-CM | POA: Diagnosis not present

## 2017-11-05 DIAGNOSIS — R351 Nocturia: Secondary | ICD-10-CM | POA: Diagnosis not present

## 2017-11-05 DIAGNOSIS — N200 Calculus of kidney: Secondary | ICD-10-CM | POA: Diagnosis not present

## 2017-11-05 DIAGNOSIS — R0609 Other forms of dyspnea: Secondary | ICD-10-CM | POA: Diagnosis not present

## 2017-11-05 DIAGNOSIS — M810 Age-related osteoporosis without current pathological fracture: Secondary | ICD-10-CM | POA: Diagnosis not present

## 2017-11-05 DIAGNOSIS — N2589 Other disorders resulting from impaired renal tubular function: Secondary | ICD-10-CM | POA: Diagnosis not present

## 2017-11-10 DIAGNOSIS — E7800 Pure hypercholesterolemia, unspecified: Secondary | ICD-10-CM | POA: Diagnosis not present

## 2017-11-10 DIAGNOSIS — I471 Supraventricular tachycardia: Secondary | ICD-10-CM | POA: Diagnosis not present

## 2017-11-10 DIAGNOSIS — E8881 Metabolic syndrome: Secondary | ICD-10-CM | POA: Diagnosis not present

## 2017-11-10 DIAGNOSIS — I10 Essential (primary) hypertension: Secondary | ICD-10-CM | POA: Diagnosis not present

## 2017-11-10 DIAGNOSIS — Z125 Encounter for screening for malignant neoplasm of prostate: Secondary | ICD-10-CM | POA: Diagnosis not present

## 2017-11-10 DIAGNOSIS — Z1339 Encounter for screening examination for other mental health and behavioral disorders: Secondary | ICD-10-CM | POA: Diagnosis not present

## 2017-11-10 DIAGNOSIS — J449 Chronic obstructive pulmonary disease, unspecified: Secondary | ICD-10-CM | POA: Diagnosis not present

## 2017-11-17 DIAGNOSIS — I471 Supraventricular tachycardia: Secondary | ICD-10-CM | POA: Diagnosis not present

## 2017-11-17 DIAGNOSIS — J449 Chronic obstructive pulmonary disease, unspecified: Secondary | ICD-10-CM | POA: Diagnosis not present

## 2017-11-17 DIAGNOSIS — I10 Essential (primary) hypertension: Secondary | ICD-10-CM | POA: Diagnosis not present

## 2017-11-17 DIAGNOSIS — E8881 Metabolic syndrome: Secondary | ICD-10-CM | POA: Diagnosis not present

## 2017-11-21 DIAGNOSIS — J449 Chronic obstructive pulmonary disease, unspecified: Secondary | ICD-10-CM | POA: Diagnosis not present

## 2017-11-21 DIAGNOSIS — I471 Supraventricular tachycardia: Secondary | ICD-10-CM | POA: Diagnosis not present

## 2017-11-21 DIAGNOSIS — I10 Essential (primary) hypertension: Secondary | ICD-10-CM | POA: Diagnosis not present

## 2017-11-21 DIAGNOSIS — E8881 Metabolic syndrome: Secondary | ICD-10-CM | POA: Diagnosis not present

## 2017-11-28 DIAGNOSIS — I471 Supraventricular tachycardia: Secondary | ICD-10-CM | POA: Diagnosis not present

## 2017-11-28 DIAGNOSIS — I10 Essential (primary) hypertension: Secondary | ICD-10-CM | POA: Diagnosis not present

## 2017-11-28 DIAGNOSIS — E7800 Pure hypercholesterolemia, unspecified: Secondary | ICD-10-CM | POA: Diagnosis not present

## 2017-11-28 DIAGNOSIS — E8881 Metabolic syndrome: Secondary | ICD-10-CM | POA: Diagnosis not present

## 2017-12-05 DIAGNOSIS — I10 Essential (primary) hypertension: Secondary | ICD-10-CM | POA: Diagnosis not present

## 2017-12-05 DIAGNOSIS — E7800 Pure hypercholesterolemia, unspecified: Secondary | ICD-10-CM | POA: Diagnosis not present

## 2017-12-05 DIAGNOSIS — E8881 Metabolic syndrome: Secondary | ICD-10-CM | POA: Diagnosis not present

## 2017-12-05 DIAGNOSIS — I471 Supraventricular tachycardia: Secondary | ICD-10-CM | POA: Diagnosis not present

## 2017-12-26 DIAGNOSIS — E7800 Pure hypercholesterolemia, unspecified: Secondary | ICD-10-CM | POA: Diagnosis not present

## 2017-12-26 DIAGNOSIS — I471 Supraventricular tachycardia: Secondary | ICD-10-CM | POA: Diagnosis not present

## 2017-12-26 DIAGNOSIS — Z23 Encounter for immunization: Secondary | ICD-10-CM | POA: Diagnosis not present

## 2017-12-26 DIAGNOSIS — E8881 Metabolic syndrome: Secondary | ICD-10-CM | POA: Diagnosis not present

## 2017-12-26 DIAGNOSIS — I10 Essential (primary) hypertension: Secondary | ICD-10-CM | POA: Diagnosis not present

## 2018-01-23 DIAGNOSIS — E7800 Pure hypercholesterolemia, unspecified: Secondary | ICD-10-CM | POA: Diagnosis not present

## 2018-01-23 DIAGNOSIS — I471 Supraventricular tachycardia: Secondary | ICD-10-CM | POA: Diagnosis not present

## 2018-01-23 DIAGNOSIS — E8881 Metabolic syndrome: Secondary | ICD-10-CM | POA: Diagnosis not present

## 2018-01-23 DIAGNOSIS — I10 Essential (primary) hypertension: Secondary | ICD-10-CM | POA: Diagnosis not present

## 2018-01-31 DIAGNOSIS — J069 Acute upper respiratory infection, unspecified: Secondary | ICD-10-CM | POA: Diagnosis not present

## 2018-02-04 DIAGNOSIS — E785 Hyperlipidemia, unspecified: Secondary | ICD-10-CM | POA: Diagnosis not present

## 2018-02-04 DIAGNOSIS — N200 Calculus of kidney: Secondary | ICD-10-CM | POA: Diagnosis not present

## 2018-02-04 DIAGNOSIS — Z87891 Personal history of nicotine dependence: Secondary | ICD-10-CM | POA: Diagnosis not present

## 2018-02-04 DIAGNOSIS — E669 Obesity, unspecified: Secondary | ICD-10-CM | POA: Diagnosis not present

## 2018-02-04 DIAGNOSIS — E611 Iron deficiency: Secondary | ICD-10-CM | POA: Diagnosis not present

## 2018-02-04 DIAGNOSIS — R0609 Other forms of dyspnea: Secondary | ICD-10-CM | POA: Diagnosis not present

## 2018-02-04 DIAGNOSIS — R0600 Dyspnea, unspecified: Secondary | ICD-10-CM | POA: Diagnosis not present

## 2018-02-04 DIAGNOSIS — Z6831 Body mass index (BMI) 31.0-31.9, adult: Secondary | ICD-10-CM | POA: Diagnosis not present

## 2018-02-04 DIAGNOSIS — N29 Other disorders of kidney and ureter in diseases classified elsewhere: Secondary | ICD-10-CM | POA: Diagnosis not present

## 2018-02-04 DIAGNOSIS — I129 Hypertensive chronic kidney disease with stage 1 through stage 4 chronic kidney disease, or unspecified chronic kidney disease: Secondary | ICD-10-CM | POA: Diagnosis not present

## 2018-02-04 DIAGNOSIS — N183 Chronic kidney disease, stage 3 (moderate): Secondary | ICD-10-CM | POA: Diagnosis not present

## 2018-02-04 DIAGNOSIS — R351 Nocturia: Secondary | ICD-10-CM | POA: Diagnosis not present

## 2018-02-04 DIAGNOSIS — N2589 Other disorders resulting from impaired renal tubular function: Secondary | ICD-10-CM | POA: Diagnosis not present

## 2018-02-04 DIAGNOSIS — Z9889 Other specified postprocedural states: Secondary | ICD-10-CM | POA: Diagnosis not present

## 2018-02-09 DIAGNOSIS — E8881 Metabolic syndrome: Secondary | ICD-10-CM | POA: Diagnosis not present

## 2018-02-09 DIAGNOSIS — I10 Essential (primary) hypertension: Secondary | ICD-10-CM | POA: Diagnosis not present

## 2018-02-09 DIAGNOSIS — E7800 Pure hypercholesterolemia, unspecified: Secondary | ICD-10-CM | POA: Diagnosis not present

## 2018-02-09 DIAGNOSIS — J208 Acute bronchitis due to other specified organisms: Secondary | ICD-10-CM | POA: Diagnosis not present

## 2018-02-14 DIAGNOSIS — N200 Calculus of kidney: Secondary | ICD-10-CM | POA: Diagnosis not present

## 2018-04-22 DIAGNOSIS — Z7289 Other problems related to lifestyle: Secondary | ICD-10-CM | POA: Diagnosis not present

## 2018-04-22 DIAGNOSIS — H903 Sensorineural hearing loss, bilateral: Secondary | ICD-10-CM | POA: Diagnosis not present

## 2018-04-22 DIAGNOSIS — Z87891 Personal history of nicotine dependence: Secondary | ICD-10-CM | POA: Diagnosis not present

## 2018-04-22 DIAGNOSIS — Z974 Presence of external hearing-aid: Secondary | ICD-10-CM | POA: Diagnosis not present

## 2018-04-22 DIAGNOSIS — H6123 Impacted cerumen, bilateral: Secondary | ICD-10-CM | POA: Diagnosis not present

## 2018-05-13 DIAGNOSIS — E872 Acidosis: Secondary | ICD-10-CM | POA: Diagnosis not present

## 2018-05-13 DIAGNOSIS — Z87891 Personal history of nicotine dependence: Secondary | ICD-10-CM | POA: Diagnosis not present

## 2018-05-13 DIAGNOSIS — N183 Chronic kidney disease, stage 3 (moderate): Secondary | ICD-10-CM | POA: Diagnosis not present

## 2018-05-13 DIAGNOSIS — R0609 Other forms of dyspnea: Secondary | ICD-10-CM | POA: Diagnosis not present

## 2018-05-13 DIAGNOSIS — Z9889 Other specified postprocedural states: Secondary | ICD-10-CM | POA: Diagnosis not present

## 2018-05-13 DIAGNOSIS — R351 Nocturia: Secondary | ICD-10-CM | POA: Diagnosis not present

## 2018-05-13 DIAGNOSIS — E785 Hyperlipidemia, unspecified: Secondary | ICD-10-CM | POA: Diagnosis not present

## 2018-05-13 DIAGNOSIS — E669 Obesity, unspecified: Secondary | ICD-10-CM | POA: Diagnosis not present

## 2018-05-13 DIAGNOSIS — N200 Calculus of kidney: Secondary | ICD-10-CM | POA: Diagnosis not present

## 2018-05-13 DIAGNOSIS — Z6832 Body mass index (BMI) 32.0-32.9, adult: Secondary | ICD-10-CM | POA: Diagnosis not present

## 2018-05-13 DIAGNOSIS — N2589 Other disorders resulting from impaired renal tubular function: Secondary | ICD-10-CM | POA: Diagnosis not present

## 2018-05-13 DIAGNOSIS — I129 Hypertensive chronic kidney disease with stage 1 through stage 4 chronic kidney disease, or unspecified chronic kidney disease: Secondary | ICD-10-CM | POA: Diagnosis not present

## 2018-05-13 DIAGNOSIS — N29 Other disorders of kidney and ureter in diseases classified elsewhere: Secondary | ICD-10-CM | POA: Diagnosis not present

## 2018-05-18 DIAGNOSIS — J449 Chronic obstructive pulmonary disease, unspecified: Secondary | ICD-10-CM | POA: Diagnosis not present

## 2018-05-18 DIAGNOSIS — I471 Supraventricular tachycardia: Secondary | ICD-10-CM | POA: Diagnosis not present

## 2018-05-18 DIAGNOSIS — Z1331 Encounter for screening for depression: Secondary | ICD-10-CM | POA: Diagnosis not present

## 2018-05-18 DIAGNOSIS — E7800 Pure hypercholesterolemia, unspecified: Secondary | ICD-10-CM | POA: Diagnosis not present

## 2018-05-18 DIAGNOSIS — I10 Essential (primary) hypertension: Secondary | ICD-10-CM | POA: Diagnosis not present

## 2018-05-18 DIAGNOSIS — Z9181 History of falling: Secondary | ICD-10-CM | POA: Diagnosis not present

## 2018-05-18 DIAGNOSIS — E8881 Metabolic syndrome: Secondary | ICD-10-CM | POA: Diagnosis not present

## 2018-06-04 DIAGNOSIS — N3000 Acute cystitis without hematuria: Secondary | ICD-10-CM | POA: Diagnosis not present

## 2018-06-04 DIAGNOSIS — R100 Acute abdomen: Secondary | ICD-10-CM | POA: Diagnosis not present

## 2018-06-16 DIAGNOSIS — K05219 Aggressive periodontitis, localized, unspecified severity: Secondary | ICD-10-CM | POA: Diagnosis not present

## 2018-06-16 DIAGNOSIS — K047 Periapical abscess without sinus: Secondary | ICD-10-CM | POA: Diagnosis not present

## 2018-06-23 DIAGNOSIS — Z79899 Other long term (current) drug therapy: Secondary | ICD-10-CM | POA: Diagnosis not present

## 2018-06-23 DIAGNOSIS — Z7982 Long term (current) use of aspirin: Secondary | ICD-10-CM | POA: Diagnosis not present

## 2018-06-23 DIAGNOSIS — M272 Inflammatory conditions of jaws: Secondary | ICD-10-CM | POA: Diagnosis not present

## 2018-06-23 DIAGNOSIS — I10 Essential (primary) hypertension: Secondary | ICD-10-CM | POA: Diagnosis not present

## 2018-07-04 DIAGNOSIS — I10 Essential (primary) hypertension: Secondary | ICD-10-CM | POA: Diagnosis not present

## 2018-07-04 DIAGNOSIS — Z79899 Other long term (current) drug therapy: Secondary | ICD-10-CM | POA: Diagnosis not present

## 2018-07-04 DIAGNOSIS — R202 Paresthesia of skin: Secondary | ICD-10-CM | POA: Diagnosis not present

## 2018-07-04 DIAGNOSIS — K047 Periapical abscess without sinus: Secondary | ICD-10-CM | POA: Diagnosis not present

## 2018-07-04 DIAGNOSIS — H9190 Unspecified hearing loss, unspecified ear: Secondary | ICD-10-CM | POA: Diagnosis not present

## 2018-07-04 DIAGNOSIS — R200 Anesthesia of skin: Secondary | ICD-10-CM | POA: Diagnosis not present

## 2018-07-04 DIAGNOSIS — Z91040 Latex allergy status: Secondary | ICD-10-CM | POA: Diagnosis not present

## 2018-07-04 DIAGNOSIS — Z7982 Long term (current) use of aspirin: Secondary | ICD-10-CM | POA: Diagnosis not present

## 2018-07-04 DIAGNOSIS — R220 Localized swelling, mass and lump, head: Secondary | ICD-10-CM | POA: Diagnosis not present

## 2018-07-04 DIAGNOSIS — Z880 Allergy status to penicillin: Secondary | ICD-10-CM | POA: Diagnosis not present

## 2018-07-04 DIAGNOSIS — Z882 Allergy status to sulfonamides status: Secondary | ICD-10-CM | POA: Diagnosis not present

## 2018-07-05 DIAGNOSIS — K122 Cellulitis and abscess of mouth: Secondary | ICD-10-CM | POA: Diagnosis not present

## 2018-07-22 DIAGNOSIS — I471 Supraventricular tachycardia: Secondary | ICD-10-CM | POA: Diagnosis not present

## 2018-07-22 DIAGNOSIS — J449 Chronic obstructive pulmonary disease, unspecified: Secondary | ICD-10-CM | POA: Diagnosis not present

## 2018-07-22 DIAGNOSIS — L0390 Cellulitis, unspecified: Secondary | ICD-10-CM | POA: Diagnosis not present

## 2018-07-22 DIAGNOSIS — E8881 Metabolic syndrome: Secondary | ICD-10-CM | POA: Diagnosis not present

## 2018-07-29 DIAGNOSIS — E8881 Metabolic syndrome: Secondary | ICD-10-CM | POA: Diagnosis not present

## 2018-07-29 DIAGNOSIS — L0390 Cellulitis, unspecified: Secondary | ICD-10-CM | POA: Diagnosis not present

## 2018-07-29 DIAGNOSIS — J449 Chronic obstructive pulmonary disease, unspecified: Secondary | ICD-10-CM | POA: Diagnosis not present

## 2018-07-29 DIAGNOSIS — I471 Supraventricular tachycardia: Secondary | ICD-10-CM | POA: Diagnosis not present

## 2018-08-08 DIAGNOSIS — K0889 Other specified disorders of teeth and supporting structures: Secondary | ICD-10-CM | POA: Diagnosis not present

## 2018-08-17 DIAGNOSIS — E7800 Pure hypercholesterolemia, unspecified: Secondary | ICD-10-CM | POA: Diagnosis not present

## 2018-08-17 DIAGNOSIS — N189 Chronic kidney disease, unspecified: Secondary | ICD-10-CM | POA: Diagnosis not present

## 2018-08-17 DIAGNOSIS — E8881 Metabolic syndrome: Secondary | ICD-10-CM | POA: Diagnosis not present

## 2018-08-17 DIAGNOSIS — E669 Obesity, unspecified: Secondary | ICD-10-CM | POA: Diagnosis not present

## 2018-08-17 DIAGNOSIS — I10 Essential (primary) hypertension: Secondary | ICD-10-CM | POA: Diagnosis not present

## 2018-08-17 DIAGNOSIS — I471 Supraventricular tachycardia: Secondary | ICD-10-CM | POA: Diagnosis not present

## 2018-08-17 DIAGNOSIS — J449 Chronic obstructive pulmonary disease, unspecified: Secondary | ICD-10-CM | POA: Diagnosis not present

## 2018-09-20 DIAGNOSIS — Z125 Encounter for screening for malignant neoplasm of prostate: Secondary | ICD-10-CM | POA: Diagnosis not present

## 2018-09-20 DIAGNOSIS — Z1331 Encounter for screening for depression: Secondary | ICD-10-CM | POA: Diagnosis not present

## 2018-09-20 DIAGNOSIS — Z0000 Encounter for general adult medical examination without abnormal findings: Secondary | ICD-10-CM | POA: Diagnosis not present

## 2018-09-20 DIAGNOSIS — Z9181 History of falling: Secondary | ICD-10-CM | POA: Diagnosis not present

## 2018-11-16 DIAGNOSIS — I10 Essential (primary) hypertension: Secondary | ICD-10-CM | POA: Diagnosis not present

## 2018-11-16 DIAGNOSIS — I471 Supraventricular tachycardia: Secondary | ICD-10-CM | POA: Diagnosis not present

## 2018-11-16 DIAGNOSIS — E669 Obesity, unspecified: Secondary | ICD-10-CM | POA: Diagnosis not present

## 2018-11-16 DIAGNOSIS — E7800 Pure hypercholesterolemia, unspecified: Secondary | ICD-10-CM | POA: Diagnosis not present

## 2018-11-16 DIAGNOSIS — E8881 Metabolic syndrome: Secondary | ICD-10-CM | POA: Diagnosis not present

## 2019-01-15 DIAGNOSIS — E8881 Metabolic syndrome: Secondary | ICD-10-CM | POA: Diagnosis not present

## 2019-01-15 DIAGNOSIS — I471 Supraventricular tachycardia: Secondary | ICD-10-CM | POA: Diagnosis not present

## 2019-01-15 DIAGNOSIS — J449 Chronic obstructive pulmonary disease, unspecified: Secondary | ICD-10-CM | POA: Diagnosis not present

## 2019-01-15 DIAGNOSIS — M659 Synovitis and tenosynovitis, unspecified: Secondary | ICD-10-CM | POA: Diagnosis not present

## 2019-01-30 DIAGNOSIS — I471 Supraventricular tachycardia: Secondary | ICD-10-CM | POA: Diagnosis not present

## 2019-01-30 DIAGNOSIS — Z139 Encounter for screening, unspecified: Secondary | ICD-10-CM | POA: Diagnosis not present

## 2019-01-30 DIAGNOSIS — E8881 Metabolic syndrome: Secondary | ICD-10-CM | POA: Diagnosis not present

## 2019-01-30 DIAGNOSIS — M659 Synovitis and tenosynovitis, unspecified: Secondary | ICD-10-CM | POA: Diagnosis not present

## 2019-01-30 DIAGNOSIS — Z23 Encounter for immunization: Secondary | ICD-10-CM | POA: Diagnosis not present

## 2019-01-30 DIAGNOSIS — J449 Chronic obstructive pulmonary disease, unspecified: Secondary | ICD-10-CM | POA: Diagnosis not present

## 2019-02-03 DIAGNOSIS — H903 Sensorineural hearing loss, bilateral: Secondary | ICD-10-CM | POA: Diagnosis not present

## 2019-02-03 DIAGNOSIS — G4730 Sleep apnea, unspecified: Secondary | ICD-10-CM | POA: Diagnosis not present

## 2019-02-03 DIAGNOSIS — Z87898 Personal history of other specified conditions: Secondary | ICD-10-CM | POA: Diagnosis not present

## 2019-02-03 DIAGNOSIS — Z9889 Other specified postprocedural states: Secondary | ICD-10-CM | POA: Diagnosis not present

## 2019-02-03 DIAGNOSIS — R0609 Other forms of dyspnea: Secondary | ICD-10-CM | POA: Diagnosis not present

## 2019-02-03 DIAGNOSIS — R634 Abnormal weight loss: Secondary | ICD-10-CM | POA: Diagnosis not present

## 2019-02-03 DIAGNOSIS — N1831 Chronic kidney disease, stage 3a: Secondary | ICD-10-CM | POA: Diagnosis not present

## 2019-02-03 DIAGNOSIS — D509 Iron deficiency anemia, unspecified: Secondary | ICD-10-CM | POA: Diagnosis not present

## 2019-02-03 DIAGNOSIS — I129 Hypertensive chronic kidney disease with stage 1 through stage 4 chronic kidney disease, or unspecified chronic kidney disease: Secondary | ICD-10-CM | POA: Diagnosis not present

## 2019-02-03 DIAGNOSIS — Z79899 Other long term (current) drug therapy: Secondary | ICD-10-CM | POA: Diagnosis not present

## 2019-02-03 DIAGNOSIS — Z6827 Body mass index (BMI) 27.0-27.9, adult: Secondary | ICD-10-CM | POA: Diagnosis not present

## 2019-02-03 DIAGNOSIS — E785 Hyperlipidemia, unspecified: Secondary | ICD-10-CM | POA: Diagnosis not present

## 2019-02-03 DIAGNOSIS — R351 Nocturia: Secondary | ICD-10-CM | POA: Diagnosis not present

## 2019-02-03 DIAGNOSIS — N29 Other disorders of kidney and ureter in diseases classified elsewhere: Secondary | ICD-10-CM | POA: Diagnosis not present

## 2019-02-03 DIAGNOSIS — E669 Obesity, unspecified: Secondary | ICD-10-CM | POA: Diagnosis not present

## 2019-02-03 DIAGNOSIS — N189 Chronic kidney disease, unspecified: Secondary | ICD-10-CM | POA: Diagnosis not present

## 2019-02-03 DIAGNOSIS — N2589 Other disorders resulting from impaired renal tubular function: Secondary | ICD-10-CM | POA: Diagnosis not present

## 2019-02-03 DIAGNOSIS — N200 Calculus of kidney: Secondary | ICD-10-CM | POA: Diagnosis not present

## 2019-02-15 DIAGNOSIS — N189 Chronic kidney disease, unspecified: Secondary | ICD-10-CM | POA: Diagnosis not present

## 2019-02-15 DIAGNOSIS — I471 Supraventricular tachycardia: Secondary | ICD-10-CM | POA: Diagnosis not present

## 2019-02-15 DIAGNOSIS — M659 Synovitis and tenosynovitis, unspecified: Secondary | ICD-10-CM | POA: Diagnosis not present

## 2019-02-15 DIAGNOSIS — E7800 Pure hypercholesterolemia, unspecified: Secondary | ICD-10-CM | POA: Diagnosis not present

## 2019-02-15 DIAGNOSIS — E8881 Metabolic syndrome: Secondary | ICD-10-CM | POA: Diagnosis not present

## 2019-02-15 DIAGNOSIS — I10 Essential (primary) hypertension: Secondary | ICD-10-CM | POA: Diagnosis not present

## 2019-05-12 DIAGNOSIS — M879 Osteonecrosis, unspecified: Secondary | ICD-10-CM | POA: Diagnosis not present

## 2019-05-12 DIAGNOSIS — M7918 Myalgia, other site: Secondary | ICD-10-CM | POA: Diagnosis not present

## 2019-05-24 DIAGNOSIS — I471 Supraventricular tachycardia: Secondary | ICD-10-CM | POA: Diagnosis not present

## 2019-05-24 DIAGNOSIS — E7800 Pure hypercholesterolemia, unspecified: Secondary | ICD-10-CM | POA: Diagnosis not present

## 2019-05-24 DIAGNOSIS — E8881 Metabolic syndrome: Secondary | ICD-10-CM | POA: Diagnosis not present

## 2019-05-24 DIAGNOSIS — M659 Synovitis and tenosynovitis, unspecified: Secondary | ICD-10-CM | POA: Diagnosis not present

## 2019-05-24 DIAGNOSIS — J449 Chronic obstructive pulmonary disease, unspecified: Secondary | ICD-10-CM | POA: Diagnosis not present

## 2019-05-24 DIAGNOSIS — I10 Essential (primary) hypertension: Secondary | ICD-10-CM | POA: Diagnosis not present

## 2019-06-23 DIAGNOSIS — H903 Sensorineural hearing loss, bilateral: Secondary | ICD-10-CM | POA: Diagnosis not present

## 2019-07-02 DIAGNOSIS — H903 Sensorineural hearing loss, bilateral: Secondary | ICD-10-CM | POA: Diagnosis not present

## 2019-08-11 DIAGNOSIS — R351 Nocturia: Secondary | ICD-10-CM | POA: Diagnosis not present

## 2019-08-11 DIAGNOSIS — N183 Chronic kidney disease, stage 3 unspecified: Secondary | ICD-10-CM | POA: Diagnosis not present

## 2019-08-11 DIAGNOSIS — E669 Obesity, unspecified: Secondary | ICD-10-CM | POA: Diagnosis not present

## 2019-08-11 DIAGNOSIS — Z6828 Body mass index (BMI) 28.0-28.9, adult: Secondary | ICD-10-CM | POA: Diagnosis not present

## 2019-08-11 DIAGNOSIS — N29 Other disorders of kidney and ureter in diseases classified elsewhere: Secondary | ICD-10-CM | POA: Diagnosis not present

## 2019-08-11 DIAGNOSIS — R0609 Other forms of dyspnea: Secondary | ICD-10-CM | POA: Diagnosis not present

## 2019-08-11 DIAGNOSIS — N2589 Other disorders resulting from impaired renal tubular function: Secondary | ICD-10-CM | POA: Diagnosis not present

## 2019-08-11 DIAGNOSIS — Z9889 Other specified postprocedural states: Secondary | ICD-10-CM | POA: Diagnosis not present

## 2019-08-11 DIAGNOSIS — I129 Hypertensive chronic kidney disease with stage 1 through stage 4 chronic kidney disease, or unspecified chronic kidney disease: Secondary | ICD-10-CM | POA: Diagnosis not present

## 2019-08-23 DIAGNOSIS — N189 Chronic kidney disease, unspecified: Secondary | ICD-10-CM | POA: Diagnosis not present

## 2019-08-23 DIAGNOSIS — E7800 Pure hypercholesterolemia, unspecified: Secondary | ICD-10-CM | POA: Diagnosis not present

## 2019-08-23 DIAGNOSIS — E8881 Metabolic syndrome: Secondary | ICD-10-CM | POA: Diagnosis not present

## 2019-08-23 DIAGNOSIS — I10 Essential (primary) hypertension: Secondary | ICD-10-CM | POA: Diagnosis not present

## 2019-08-23 DIAGNOSIS — I471 Supraventricular tachycardia: Secondary | ICD-10-CM | POA: Diagnosis not present

## 2019-08-23 DIAGNOSIS — J449 Chronic obstructive pulmonary disease, unspecified: Secondary | ICD-10-CM | POA: Diagnosis not present

## 2019-09-24 DIAGNOSIS — M1811 Unilateral primary osteoarthritis of first carpometacarpal joint, right hand: Secondary | ICD-10-CM | POA: Diagnosis not present

## 2019-09-24 DIAGNOSIS — Z1159 Encounter for screening for other viral diseases: Secondary | ICD-10-CM | POA: Diagnosis not present

## 2019-10-30 DIAGNOSIS — N183 Chronic kidney disease, stage 3 unspecified: Secondary | ICD-10-CM | POA: Diagnosis not present

## 2019-10-30 DIAGNOSIS — K047 Periapical abscess without sinus: Secondary | ICD-10-CM | POA: Diagnosis not present

## 2019-10-30 DIAGNOSIS — N200 Calculus of kidney: Secondary | ICD-10-CM | POA: Diagnosis not present

## 2019-10-30 DIAGNOSIS — I129 Hypertensive chronic kidney disease with stage 1 through stage 4 chronic kidney disease, or unspecified chronic kidney disease: Secondary | ICD-10-CM | POA: Diagnosis not present

## 2019-10-30 DIAGNOSIS — N29 Other disorders of kidney and ureter in diseases classified elsewhere: Secondary | ICD-10-CM | POA: Diagnosis not present

## 2019-10-30 DIAGNOSIS — R351 Nocturia: Secondary | ICD-10-CM | POA: Diagnosis not present

## 2019-10-30 DIAGNOSIS — E669 Obesity, unspecified: Secondary | ICD-10-CM | POA: Diagnosis not present

## 2019-10-30 DIAGNOSIS — D509 Iron deficiency anemia, unspecified: Secondary | ICD-10-CM | POA: Diagnosis not present

## 2019-10-30 DIAGNOSIS — R0600 Dyspnea, unspecified: Secondary | ICD-10-CM | POA: Diagnosis not present

## 2019-10-30 DIAGNOSIS — R0609 Other forms of dyspnea: Secondary | ICD-10-CM | POA: Diagnosis not present

## 2019-10-30 DIAGNOSIS — N2589 Other disorders resulting from impaired renal tubular function: Secondary | ICD-10-CM | POA: Diagnosis not present

## 2019-10-30 DIAGNOSIS — Z9079 Acquired absence of other genital organ(s): Secondary | ICD-10-CM | POA: Diagnosis not present

## 2019-11-15 DIAGNOSIS — I471 Supraventricular tachycardia: Secondary | ICD-10-CM | POA: Diagnosis not present

## 2019-11-15 DIAGNOSIS — J449 Chronic obstructive pulmonary disease, unspecified: Secondary | ICD-10-CM | POA: Diagnosis not present

## 2019-11-15 DIAGNOSIS — E8881 Metabolic syndrome: Secondary | ICD-10-CM | POA: Diagnosis not present

## 2019-11-15 DIAGNOSIS — H9193 Unspecified hearing loss, bilateral: Secondary | ICD-10-CM | POA: Diagnosis not present

## 2019-11-15 DIAGNOSIS — Z6830 Body mass index (BMI) 30.0-30.9, adult: Secondary | ICD-10-CM | POA: Diagnosis not present

## 2019-11-15 DIAGNOSIS — F17200 Nicotine dependence, unspecified, uncomplicated: Secondary | ICD-10-CM | POA: Diagnosis not present

## 2019-11-15 DIAGNOSIS — N189 Chronic kidney disease, unspecified: Secondary | ICD-10-CM | POA: Diagnosis not present

## 2019-11-15 DIAGNOSIS — I10 Essential (primary) hypertension: Secondary | ICD-10-CM | POA: Diagnosis not present

## 2019-11-15 DIAGNOSIS — E7800 Pure hypercholesterolemia, unspecified: Secondary | ICD-10-CM | POA: Diagnosis not present

## 2019-11-15 DIAGNOSIS — E669 Obesity, unspecified: Secondary | ICD-10-CM | POA: Diagnosis not present

## 2019-11-21 DIAGNOSIS — N189 Chronic kidney disease, unspecified: Secondary | ICD-10-CM | POA: Diagnosis not present

## 2019-11-21 DIAGNOSIS — F17200 Nicotine dependence, unspecified, uncomplicated: Secondary | ICD-10-CM | POA: Diagnosis not present

## 2019-11-21 DIAGNOSIS — E8881 Metabolic syndrome: Secondary | ICD-10-CM | POA: Diagnosis not present

## 2019-11-21 DIAGNOSIS — I471 Supraventricular tachycardia: Secondary | ICD-10-CM | POA: Diagnosis not present

## 2019-11-21 DIAGNOSIS — E7800 Pure hypercholesterolemia, unspecified: Secondary | ICD-10-CM | POA: Diagnosis not present

## 2019-11-21 DIAGNOSIS — H9193 Unspecified hearing loss, bilateral: Secondary | ICD-10-CM | POA: Diagnosis not present

## 2019-11-21 DIAGNOSIS — Z6831 Body mass index (BMI) 31.0-31.9, adult: Secondary | ICD-10-CM | POA: Diagnosis not present

## 2019-11-21 DIAGNOSIS — E669 Obesity, unspecified: Secondary | ICD-10-CM | POA: Diagnosis not present

## 2019-11-21 DIAGNOSIS — I10 Essential (primary) hypertension: Secondary | ICD-10-CM | POA: Diagnosis not present

## 2019-11-21 DIAGNOSIS — J449 Chronic obstructive pulmonary disease, unspecified: Secondary | ICD-10-CM | POA: Diagnosis not present

## 2019-11-22 DIAGNOSIS — E7800 Pure hypercholesterolemia, unspecified: Secondary | ICD-10-CM | POA: Diagnosis not present

## 2019-11-22 DIAGNOSIS — E8881 Metabolic syndrome: Secondary | ICD-10-CM | POA: Diagnosis not present

## 2019-11-22 DIAGNOSIS — E669 Obesity, unspecified: Secondary | ICD-10-CM | POA: Diagnosis not present

## 2019-11-22 DIAGNOSIS — F17200 Nicotine dependence, unspecified, uncomplicated: Secondary | ICD-10-CM | POA: Diagnosis not present

## 2019-11-22 DIAGNOSIS — I471 Supraventricular tachycardia: Secondary | ICD-10-CM | POA: Diagnosis not present

## 2019-11-22 DIAGNOSIS — N189 Chronic kidney disease, unspecified: Secondary | ICD-10-CM | POA: Diagnosis not present

## 2019-11-22 DIAGNOSIS — M659 Synovitis and tenosynovitis, unspecified: Secondary | ICD-10-CM | POA: Diagnosis not present

## 2019-11-22 DIAGNOSIS — J449 Chronic obstructive pulmonary disease, unspecified: Secondary | ICD-10-CM | POA: Diagnosis not present

## 2019-11-22 DIAGNOSIS — H9193 Unspecified hearing loss, bilateral: Secondary | ICD-10-CM | POA: Diagnosis not present

## 2019-11-22 DIAGNOSIS — M109 Gout, unspecified: Secondary | ICD-10-CM | POA: Diagnosis not present

## 2019-11-22 DIAGNOSIS — I10 Essential (primary) hypertension: Secondary | ICD-10-CM | POA: Diagnosis not present

## 2019-11-22 DIAGNOSIS — Z6831 Body mass index (BMI) 31.0-31.9, adult: Secondary | ICD-10-CM | POA: Diagnosis not present

## 2019-11-29 DIAGNOSIS — E669 Obesity, unspecified: Secondary | ICD-10-CM | POA: Diagnosis not present

## 2019-11-29 DIAGNOSIS — E8881 Metabolic syndrome: Secondary | ICD-10-CM | POA: Diagnosis not present

## 2019-11-29 DIAGNOSIS — N189 Chronic kidney disease, unspecified: Secondary | ICD-10-CM | POA: Diagnosis not present

## 2019-11-29 DIAGNOSIS — Z6831 Body mass index (BMI) 31.0-31.9, adult: Secondary | ICD-10-CM | POA: Diagnosis not present

## 2019-11-29 DIAGNOSIS — F17200 Nicotine dependence, unspecified, uncomplicated: Secondary | ICD-10-CM | POA: Diagnosis not present

## 2019-11-29 DIAGNOSIS — H9193 Unspecified hearing loss, bilateral: Secondary | ICD-10-CM | POA: Diagnosis not present

## 2019-11-29 DIAGNOSIS — I10 Essential (primary) hypertension: Secondary | ICD-10-CM | POA: Diagnosis not present

## 2019-11-29 DIAGNOSIS — E7800 Pure hypercholesterolemia, unspecified: Secondary | ICD-10-CM | POA: Diagnosis not present

## 2019-11-29 DIAGNOSIS — I471 Supraventricular tachycardia: Secondary | ICD-10-CM | POA: Diagnosis not present

## 2019-11-29 DIAGNOSIS — J449 Chronic obstructive pulmonary disease, unspecified: Secondary | ICD-10-CM | POA: Diagnosis not present

## 2020-01-15 DIAGNOSIS — I10 Essential (primary) hypertension: Secondary | ICD-10-CM | POA: Diagnosis not present

## 2020-01-15 DIAGNOSIS — E7800 Pure hypercholesterolemia, unspecified: Secondary | ICD-10-CM | POA: Diagnosis not present

## 2020-01-15 DIAGNOSIS — E669 Obesity, unspecified: Secondary | ICD-10-CM | POA: Diagnosis not present

## 2020-01-15 DIAGNOSIS — L0390 Cellulitis, unspecified: Secondary | ICD-10-CM | POA: Diagnosis not present

## 2020-01-15 DIAGNOSIS — F17200 Nicotine dependence, unspecified, uncomplicated: Secondary | ICD-10-CM | POA: Diagnosis not present

## 2020-01-15 DIAGNOSIS — H9193 Unspecified hearing loss, bilateral: Secondary | ICD-10-CM | POA: Diagnosis not present

## 2020-01-15 DIAGNOSIS — J449 Chronic obstructive pulmonary disease, unspecified: Secondary | ICD-10-CM | POA: Diagnosis not present

## 2020-01-15 DIAGNOSIS — Z6831 Body mass index (BMI) 31.0-31.9, adult: Secondary | ICD-10-CM | POA: Diagnosis not present

## 2020-01-15 DIAGNOSIS — E8881 Metabolic syndrome: Secondary | ICD-10-CM | POA: Diagnosis not present

## 2020-01-15 DIAGNOSIS — N189 Chronic kidney disease, unspecified: Secondary | ICD-10-CM | POA: Diagnosis not present

## 2020-01-15 DIAGNOSIS — I471 Supraventricular tachycardia: Secondary | ICD-10-CM | POA: Diagnosis not present

## 2020-01-31 DIAGNOSIS — I471 Supraventricular tachycardia: Secondary | ICD-10-CM | POA: Diagnosis not present

## 2020-01-31 DIAGNOSIS — E8881 Metabolic syndrome: Secondary | ICD-10-CM | POA: Diagnosis not present

## 2020-01-31 DIAGNOSIS — Z6831 Body mass index (BMI) 31.0-31.9, adult: Secondary | ICD-10-CM | POA: Diagnosis not present

## 2020-01-31 DIAGNOSIS — E7800 Pure hypercholesterolemia, unspecified: Secondary | ICD-10-CM | POA: Diagnosis not present

## 2020-01-31 DIAGNOSIS — Z23 Encounter for immunization: Secondary | ICD-10-CM | POA: Diagnosis not present

## 2020-01-31 DIAGNOSIS — J449 Chronic obstructive pulmonary disease, unspecified: Secondary | ICD-10-CM | POA: Diagnosis not present

## 2020-01-31 DIAGNOSIS — E669 Obesity, unspecified: Secondary | ICD-10-CM | POA: Diagnosis not present

## 2020-01-31 DIAGNOSIS — N189 Chronic kidney disease, unspecified: Secondary | ICD-10-CM | POA: Diagnosis not present

## 2020-01-31 DIAGNOSIS — H9193 Unspecified hearing loss, bilateral: Secondary | ICD-10-CM | POA: Diagnosis not present

## 2020-01-31 DIAGNOSIS — F17200 Nicotine dependence, unspecified, uncomplicated: Secondary | ICD-10-CM | POA: Diagnosis not present

## 2020-01-31 DIAGNOSIS — I10 Essential (primary) hypertension: Secondary | ICD-10-CM | POA: Diagnosis not present

## 2020-02-11 DIAGNOSIS — Z23 Encounter for immunization: Secondary | ICD-10-CM | POA: Diagnosis not present

## 2020-02-18 DIAGNOSIS — M19042 Primary osteoarthritis, left hand: Secondary | ICD-10-CM | POA: Diagnosis not present

## 2020-03-05 DIAGNOSIS — J069 Acute upper respiratory infection, unspecified: Secondary | ICD-10-CM | POA: Diagnosis not present

## 2020-03-05 DIAGNOSIS — R051 Acute cough: Secondary | ICD-10-CM | POA: Diagnosis not present

## 2020-03-15 DIAGNOSIS — N2589 Other disorders resulting from impaired renal tubular function: Secondary | ICD-10-CM | POA: Diagnosis not present

## 2020-03-15 DIAGNOSIS — E611 Iron deficiency: Secondary | ICD-10-CM | POA: Diagnosis not present

## 2020-03-15 DIAGNOSIS — E669 Obesity, unspecified: Secondary | ICD-10-CM | POA: Diagnosis not present

## 2020-03-15 DIAGNOSIS — Z6829 Body mass index (BMI) 29.0-29.9, adult: Secondary | ICD-10-CM | POA: Diagnosis not present

## 2020-03-15 DIAGNOSIS — M810 Age-related osteoporosis without current pathological fracture: Secondary | ICD-10-CM | POA: Diagnosis not present

## 2020-03-15 DIAGNOSIS — R079 Chest pain, unspecified: Secondary | ICD-10-CM | POA: Diagnosis not present

## 2020-03-15 DIAGNOSIS — N200 Calculus of kidney: Secondary | ICD-10-CM | POA: Diagnosis not present

## 2020-03-15 DIAGNOSIS — E785 Hyperlipidemia, unspecified: Secondary | ICD-10-CM | POA: Diagnosis not present

## 2020-03-15 DIAGNOSIS — R351 Nocturia: Secondary | ICD-10-CM | POA: Diagnosis not present

## 2020-03-15 DIAGNOSIS — N183 Chronic kidney disease, stage 3 unspecified: Secondary | ICD-10-CM | POA: Diagnosis not present

## 2020-03-15 DIAGNOSIS — N1831 Chronic kidney disease, stage 3a: Secondary | ICD-10-CM | POA: Diagnosis not present

## 2020-03-15 DIAGNOSIS — I129 Hypertensive chronic kidney disease with stage 1 through stage 4 chronic kidney disease, or unspecified chronic kidney disease: Secondary | ICD-10-CM | POA: Diagnosis not present

## 2020-03-15 DIAGNOSIS — Z87891 Personal history of nicotine dependence: Secondary | ICD-10-CM | POA: Diagnosis not present

## 2020-03-15 DIAGNOSIS — G4730 Sleep apnea, unspecified: Secondary | ICD-10-CM | POA: Diagnosis not present

## 2020-03-15 DIAGNOSIS — R0609 Other forms of dyspnea: Secondary | ICD-10-CM | POA: Diagnosis not present

## 2020-04-08 DIAGNOSIS — M1811 Unilateral primary osteoarthritis of first carpometacarpal joint, right hand: Secondary | ICD-10-CM | POA: Diagnosis not present

## 2020-05-03 DIAGNOSIS — R350 Frequency of micturition: Secondary | ICD-10-CM | POA: Diagnosis not present

## 2020-05-03 DIAGNOSIS — R351 Nocturia: Secondary | ICD-10-CM | POA: Diagnosis not present

## 2020-05-03 DIAGNOSIS — Z79899 Other long term (current) drug therapy: Secondary | ICD-10-CM | POA: Diagnosis not present

## 2020-05-03 DIAGNOSIS — N401 Enlarged prostate with lower urinary tract symptoms: Secondary | ICD-10-CM | POA: Diagnosis not present

## 2020-05-10 ENCOUNTER — Other Ambulatory Visit: Payer: Self-pay

## 2020-05-10 ENCOUNTER — Ambulatory Visit (INDEPENDENT_AMBULATORY_CARE_PROVIDER_SITE_OTHER): Payer: Medicare Other | Admitting: Podiatry

## 2020-05-10 DIAGNOSIS — R234 Changes in skin texture: Secondary | ICD-10-CM | POA: Diagnosis not present

## 2020-05-10 DIAGNOSIS — M79675 Pain in left toe(s): Secondary | ICD-10-CM

## 2020-05-10 DIAGNOSIS — L853 Xerosis cutis: Secondary | ICD-10-CM

## 2020-05-10 NOTE — Progress Notes (Signed)
  Subjective:  Patient ID: Gregg Washington, male    DOB: May 29, 1945,  MRN: [id number]  No chief complaint on file.  75 y.o. male presents with the above complaint. History confirmed with patient. States that he injured the left big toe while shoveling snow. States it is sore but not draining.   Objective:  Physical Exam: warm, good capillary refill, no trophic changes or ulcerative lesions, normal DP and PT pulses and normal sensory exam. Left Foot: left medial 1st MPJ dried hemorragic blister with skin fissure.  Assessment:   1. Skin fissure   2. Pain of left great toe   3. Xerosis cutis    Plan:  Patient was evaluated and treated and all questions answered.  Skin fissure, blister -Minimally debrided today. -Soak daily and apply neosporin with band-aid. -F/u should issues persist.

## 2020-05-10 NOTE — Patient Instructions (Signed)
Soak foot daily in water and epsom salt for 20 minutes. After this, dry your foot and then apply neosporin and a band-aid. This should help the wound heal within a week.

## 2020-05-15 DIAGNOSIS — E7800 Pure hypercholesterolemia, unspecified: Secondary | ICD-10-CM | POA: Diagnosis not present

## 2020-05-15 DIAGNOSIS — Z6831 Body mass index (BMI) 31.0-31.9, adult: Secondary | ICD-10-CM | POA: Diagnosis not present

## 2020-05-15 DIAGNOSIS — I471 Supraventricular tachycardia: Secondary | ICD-10-CM | POA: Diagnosis not present

## 2020-05-15 DIAGNOSIS — N189 Chronic kidney disease, unspecified: Secondary | ICD-10-CM | POA: Diagnosis not present

## 2020-05-15 DIAGNOSIS — J449 Chronic obstructive pulmonary disease, unspecified: Secondary | ICD-10-CM | POA: Diagnosis not present

## 2020-05-15 DIAGNOSIS — H9193 Unspecified hearing loss, bilateral: Secondary | ICD-10-CM | POA: Diagnosis not present

## 2020-05-15 DIAGNOSIS — I10 Essential (primary) hypertension: Secondary | ICD-10-CM | POA: Diagnosis not present

## 2020-05-15 DIAGNOSIS — E669 Obesity, unspecified: Secondary | ICD-10-CM | POA: Diagnosis not present

## 2020-05-15 DIAGNOSIS — F17200 Nicotine dependence, unspecified, uncomplicated: Secondary | ICD-10-CM | POA: Diagnosis not present

## 2020-05-15 DIAGNOSIS — E8881 Metabolic syndrome: Secondary | ICD-10-CM | POA: Diagnosis not present

## 2020-05-15 DIAGNOSIS — F0390 Unspecified dementia without behavioral disturbance: Secondary | ICD-10-CM | POA: Diagnosis not present

## 2020-05-20 DIAGNOSIS — M1811 Unilateral primary osteoarthritis of first carpometacarpal joint, right hand: Secondary | ICD-10-CM | POA: Diagnosis not present

## 2020-05-27 DIAGNOSIS — H903 Sensorineural hearing loss, bilateral: Secondary | ICD-10-CM | POA: Diagnosis not present

## 2020-05-27 DIAGNOSIS — R350 Frequency of micturition: Secondary | ICD-10-CM | POA: Diagnosis not present

## 2020-05-27 DIAGNOSIS — R351 Nocturia: Secondary | ICD-10-CM | POA: Diagnosis not present

## 2020-05-27 DIAGNOSIS — N401 Enlarged prostate with lower urinary tract symptoms: Secondary | ICD-10-CM | POA: Diagnosis not present

## 2020-06-10 DIAGNOSIS — E8881 Metabolic syndrome: Secondary | ICD-10-CM | POA: Diagnosis not present

## 2020-06-10 DIAGNOSIS — F0390 Unspecified dementia without behavioral disturbance: Secondary | ICD-10-CM | POA: Diagnosis not present

## 2020-06-10 DIAGNOSIS — H9193 Unspecified hearing loss, bilateral: Secondary | ICD-10-CM | POA: Diagnosis not present

## 2020-06-10 DIAGNOSIS — I471 Supraventricular tachycardia: Secondary | ICD-10-CM | POA: Diagnosis not present

## 2020-06-10 DIAGNOSIS — I10 Essential (primary) hypertension: Secondary | ICD-10-CM | POA: Diagnosis not present

## 2020-06-10 DIAGNOSIS — N189 Chronic kidney disease, unspecified: Secondary | ICD-10-CM | POA: Diagnosis not present

## 2020-06-10 DIAGNOSIS — J449 Chronic obstructive pulmonary disease, unspecified: Secondary | ICD-10-CM | POA: Diagnosis not present

## 2020-06-10 DIAGNOSIS — Z6831 Body mass index (BMI) 31.0-31.9, adult: Secondary | ICD-10-CM | POA: Diagnosis not present

## 2020-06-10 DIAGNOSIS — F17200 Nicotine dependence, unspecified, uncomplicated: Secondary | ICD-10-CM | POA: Diagnosis not present

## 2020-06-10 DIAGNOSIS — E669 Obesity, unspecified: Secondary | ICD-10-CM | POA: Diagnosis not present

## 2020-06-10 DIAGNOSIS — E7800 Pure hypercholesterolemia, unspecified: Secondary | ICD-10-CM | POA: Diagnosis not present

## 2020-07-13 DIAGNOSIS — Z23 Encounter for immunization: Secondary | ICD-10-CM | POA: Diagnosis not present

## 2020-07-26 DIAGNOSIS — E7800 Pure hypercholesterolemia, unspecified: Secondary | ICD-10-CM | POA: Diagnosis not present

## 2020-07-26 DIAGNOSIS — E8881 Metabolic syndrome: Secondary | ICD-10-CM | POA: Diagnosis not present

## 2020-07-26 DIAGNOSIS — I471 Supraventricular tachycardia: Secondary | ICD-10-CM | POA: Diagnosis not present

## 2020-07-26 DIAGNOSIS — E669 Obesity, unspecified: Secondary | ICD-10-CM | POA: Diagnosis not present

## 2020-07-26 DIAGNOSIS — M109 Gout, unspecified: Secondary | ICD-10-CM | POA: Diagnosis not present

## 2020-07-26 DIAGNOSIS — F0390 Unspecified dementia without behavioral disturbance: Secondary | ICD-10-CM | POA: Diagnosis not present

## 2020-07-26 DIAGNOSIS — H9193 Unspecified hearing loss, bilateral: Secondary | ICD-10-CM | POA: Diagnosis not present

## 2020-07-26 DIAGNOSIS — N189 Chronic kidney disease, unspecified: Secondary | ICD-10-CM | POA: Diagnosis not present

## 2020-07-26 DIAGNOSIS — I10 Essential (primary) hypertension: Secondary | ICD-10-CM | POA: Diagnosis not present

## 2020-07-26 DIAGNOSIS — M659 Synovitis and tenosynovitis, unspecified: Secondary | ICD-10-CM | POA: Diagnosis not present

## 2020-07-26 DIAGNOSIS — J449 Chronic obstructive pulmonary disease, unspecified: Secondary | ICD-10-CM | POA: Diagnosis not present

## 2020-07-26 DIAGNOSIS — F17200 Nicotine dependence, unspecified, uncomplicated: Secondary | ICD-10-CM | POA: Diagnosis not present

## 2020-08-02 DIAGNOSIS — E8881 Metabolic syndrome: Secondary | ICD-10-CM | POA: Diagnosis not present

## 2020-08-02 DIAGNOSIS — F17200 Nicotine dependence, unspecified, uncomplicated: Secondary | ICD-10-CM | POA: Diagnosis not present

## 2020-08-02 DIAGNOSIS — J449 Chronic obstructive pulmonary disease, unspecified: Secondary | ICD-10-CM | POA: Diagnosis not present

## 2020-08-02 DIAGNOSIS — M659 Synovitis and tenosynovitis, unspecified: Secondary | ICD-10-CM | POA: Diagnosis not present

## 2020-08-02 DIAGNOSIS — N189 Chronic kidney disease, unspecified: Secondary | ICD-10-CM | POA: Diagnosis not present

## 2020-08-02 DIAGNOSIS — M109 Gout, unspecified: Secondary | ICD-10-CM | POA: Diagnosis not present

## 2020-08-02 DIAGNOSIS — I471 Supraventricular tachycardia: Secondary | ICD-10-CM | POA: Diagnosis not present

## 2020-08-02 DIAGNOSIS — I10 Essential (primary) hypertension: Secondary | ICD-10-CM | POA: Diagnosis not present

## 2020-08-02 DIAGNOSIS — F0390 Unspecified dementia without behavioral disturbance: Secondary | ICD-10-CM | POA: Diagnosis not present

## 2020-08-02 DIAGNOSIS — E7800 Pure hypercholesterolemia, unspecified: Secondary | ICD-10-CM | POA: Diagnosis not present

## 2020-08-02 DIAGNOSIS — Z139 Encounter for screening, unspecified: Secondary | ICD-10-CM | POA: Diagnosis not present

## 2020-08-02 DIAGNOSIS — H9193 Unspecified hearing loss, bilateral: Secondary | ICD-10-CM | POA: Diagnosis not present

## 2020-08-14 DIAGNOSIS — I471 Supraventricular tachycardia: Secondary | ICD-10-CM | POA: Diagnosis not present

## 2020-08-14 DIAGNOSIS — E8881 Metabolic syndrome: Secondary | ICD-10-CM | POA: Diagnosis not present

## 2020-08-14 DIAGNOSIS — M659 Synovitis and tenosynovitis, unspecified: Secondary | ICD-10-CM | POA: Diagnosis not present

## 2020-08-14 DIAGNOSIS — H9193 Unspecified hearing loss, bilateral: Secondary | ICD-10-CM | POA: Diagnosis not present

## 2020-08-14 DIAGNOSIS — E7800 Pure hypercholesterolemia, unspecified: Secondary | ICD-10-CM | POA: Diagnosis not present

## 2020-08-14 DIAGNOSIS — M109 Gout, unspecified: Secondary | ICD-10-CM | POA: Diagnosis not present

## 2020-08-14 DIAGNOSIS — E669 Obesity, unspecified: Secondary | ICD-10-CM | POA: Diagnosis not present

## 2020-08-14 DIAGNOSIS — I10 Essential (primary) hypertension: Secondary | ICD-10-CM | POA: Diagnosis not present

## 2020-08-14 DIAGNOSIS — N189 Chronic kidney disease, unspecified: Secondary | ICD-10-CM | POA: Diagnosis not present

## 2020-08-14 DIAGNOSIS — F0390 Unspecified dementia without behavioral disturbance: Secondary | ICD-10-CM | POA: Diagnosis not present

## 2020-08-14 DIAGNOSIS — J449 Chronic obstructive pulmonary disease, unspecified: Secondary | ICD-10-CM | POA: Diagnosis not present

## 2020-08-14 DIAGNOSIS — F17200 Nicotine dependence, unspecified, uncomplicated: Secondary | ICD-10-CM | POA: Diagnosis not present

## 2020-08-16 DIAGNOSIS — N29 Other disorders of kidney and ureter in diseases classified elsewhere: Secondary | ICD-10-CM | POA: Diagnosis not present

## 2020-08-16 DIAGNOSIS — I129 Hypertensive chronic kidney disease with stage 1 through stage 4 chronic kidney disease, or unspecified chronic kidney disease: Secondary | ICD-10-CM | POA: Diagnosis not present

## 2020-08-16 DIAGNOSIS — F0390 Unspecified dementia without behavioral disturbance: Secondary | ICD-10-CM | POA: Diagnosis not present

## 2020-08-16 DIAGNOSIS — E669 Obesity, unspecified: Secondary | ICD-10-CM | POA: Diagnosis not present

## 2020-08-16 DIAGNOSIS — N2589 Other disorders resulting from impaired renal tubular function: Secondary | ICD-10-CM | POA: Diagnosis not present

## 2020-08-16 DIAGNOSIS — R0609 Other forms of dyspnea: Secondary | ICD-10-CM | POA: Diagnosis not present

## 2020-08-16 DIAGNOSIS — N183 Chronic kidney disease, stage 3 unspecified: Secondary | ICD-10-CM | POA: Diagnosis not present

## 2020-09-15 DIAGNOSIS — R350 Frequency of micturition: Secondary | ICD-10-CM | POA: Diagnosis not present

## 2020-09-15 DIAGNOSIS — Z125 Encounter for screening for malignant neoplasm of prostate: Secondary | ICD-10-CM | POA: Diagnosis not present

## 2020-09-15 DIAGNOSIS — N401 Enlarged prostate with lower urinary tract symptoms: Secondary | ICD-10-CM | POA: Diagnosis not present

## 2020-09-15 DIAGNOSIS — R351 Nocturia: Secondary | ICD-10-CM | POA: Diagnosis not present

## 2020-09-15 DIAGNOSIS — R609 Edema, unspecified: Secondary | ICD-10-CM | POA: Diagnosis not present

## 2020-09-28 DIAGNOSIS — I471 Supraventricular tachycardia: Secondary | ICD-10-CM | POA: Diagnosis not present

## 2020-09-28 DIAGNOSIS — I10 Essential (primary) hypertension: Secondary | ICD-10-CM | POA: Diagnosis not present

## 2020-09-28 DIAGNOSIS — E669 Obesity, unspecified: Secondary | ICD-10-CM | POA: Diagnosis not present

## 2020-09-28 DIAGNOSIS — Z1331 Encounter for screening for depression: Secondary | ICD-10-CM | POA: Diagnosis not present

## 2020-09-28 DIAGNOSIS — Z9181 History of falling: Secondary | ICD-10-CM | POA: Diagnosis not present

## 2020-09-28 DIAGNOSIS — J449 Chronic obstructive pulmonary disease, unspecified: Secondary | ICD-10-CM | POA: Diagnosis not present

## 2020-09-28 DIAGNOSIS — E8881 Metabolic syndrome: Secondary | ICD-10-CM | POA: Diagnosis not present

## 2020-09-28 DIAGNOSIS — Z0000 Encounter for general adult medical examination without abnormal findings: Secondary | ICD-10-CM | POA: Diagnosis not present

## 2020-09-28 DIAGNOSIS — Z6832 Body mass index (BMI) 32.0-32.9, adult: Secondary | ICD-10-CM | POA: Diagnosis not present

## 2020-09-28 DIAGNOSIS — E7800 Pure hypercholesterolemia, unspecified: Secondary | ICD-10-CM | POA: Diagnosis not present

## 2020-09-28 DIAGNOSIS — Z125 Encounter for screening for malignant neoplasm of prostate: Secondary | ICD-10-CM | POA: Diagnosis not present

## 2020-09-28 DIAGNOSIS — F0390 Unspecified dementia without behavioral disturbance: Secondary | ICD-10-CM | POA: Diagnosis not present

## 2020-10-04 DIAGNOSIS — E8881 Metabolic syndrome: Secondary | ICD-10-CM | POA: Diagnosis not present

## 2020-10-04 DIAGNOSIS — J449 Chronic obstructive pulmonary disease, unspecified: Secondary | ICD-10-CM | POA: Diagnosis not present

## 2020-10-04 DIAGNOSIS — N189 Chronic kidney disease, unspecified: Secondary | ICD-10-CM | POA: Diagnosis not present

## 2020-10-04 DIAGNOSIS — I471 Supraventricular tachycardia: Secondary | ICD-10-CM | POA: Diagnosis not present

## 2020-10-04 DIAGNOSIS — F0390 Unspecified dementia without behavioral disturbance: Secondary | ICD-10-CM | POA: Diagnosis not present

## 2020-10-04 DIAGNOSIS — E7800 Pure hypercholesterolemia, unspecified: Secondary | ICD-10-CM | POA: Diagnosis not present

## 2020-10-04 DIAGNOSIS — H9193 Unspecified hearing loss, bilateral: Secondary | ICD-10-CM | POA: Diagnosis not present

## 2020-10-04 DIAGNOSIS — I10 Essential (primary) hypertension: Secondary | ICD-10-CM | POA: Diagnosis not present

## 2020-10-04 DIAGNOSIS — E669 Obesity, unspecified: Secondary | ICD-10-CM | POA: Diagnosis not present

## 2020-10-04 DIAGNOSIS — F17200 Nicotine dependence, unspecified, uncomplicated: Secondary | ICD-10-CM | POA: Diagnosis not present

## 2020-10-04 DIAGNOSIS — Z6830 Body mass index (BMI) 30.0-30.9, adult: Secondary | ICD-10-CM | POA: Diagnosis not present

## 2020-10-04 DIAGNOSIS — M109 Gout, unspecified: Secondary | ICD-10-CM | POA: Diagnosis not present

## 2020-10-22 DIAGNOSIS — R413 Other amnesia: Secondary | ICD-10-CM | POA: Diagnosis not present

## 2020-10-31 ENCOUNTER — Encounter: Payer: Self-pay | Admitting: Emergency Medicine

## 2020-10-31 ENCOUNTER — Emergency Department (INDEPENDENT_AMBULATORY_CARE_PROVIDER_SITE_OTHER): Payer: Medicare Other

## 2020-10-31 ENCOUNTER — Other Ambulatory Visit: Payer: Self-pay

## 2020-10-31 ENCOUNTER — Emergency Department (INDEPENDENT_AMBULATORY_CARE_PROVIDER_SITE_OTHER)
Admission: EM | Admit: 2020-10-31 | Discharge: 2020-10-31 | Disposition: A | Discharge status: Home / Self Care | Payer: Medicare Other | Source: Home / Self Care | Attending: Emergency Medicine | Admitting: Emergency Medicine

## 2020-10-31 DIAGNOSIS — Z23 Encounter for immunization: Secondary | ICD-10-CM | POA: Diagnosis not present

## 2020-10-31 DIAGNOSIS — S81812A Laceration without foreign body, left lower leg, initial encounter: Secondary | ICD-10-CM | POA: Diagnosis not present

## 2020-10-31 HISTORY — DX: Unspecified hearing loss, bilateral: H91.93

## 2020-10-31 HISTORY — DX: Disorder of kidney and ureter, unspecified: N28.9

## 2020-10-31 HISTORY — DX: Other amnesia: R41.3

## 2020-10-31 MED ORDER — TETANUS-DIPHTH-ACELL PERTUSSIS 5-2.5-18.5 LF-MCG/0.5 IM SUSY
0.5000 mL | PREFILLED_SYRINGE | Freq: Once | INTRAMUSCULAR | Status: AC
  Administered 2020-10-31: 0.5 mL via INTRAMUSCULAR

## 2020-10-31 MED ORDER — HIBICLENS 4 % EX LIQD: Freq: Every day | CUTANEOUS | 0 refills | Status: AC | PRN

## 2020-10-31 MED ORDER — DOXYCYCLINE HYCLATE 100 MG PO CAPS: 100.0000 mg | ORAL_CAPSULE | Freq: Two times a day (BID) | ORAL | 0 refills | Status: AC

## 2020-10-31 NOTE — ED Triage Notes (Addendum)
Left leg laceration 2 weeks ago  Cut it on a u haul truck step into truck  Present w/ cellulitis to left leg  ASL AMN interpretor Dry dressing in place  tDap - last unknown

## 2020-10-31 NOTE — ED Provider Notes (Addendum)
HPI  SUBJECTIVE:  Gregg Washington is a 75 y.o. male who presents with a possible infection to the laceration on his left lower extremity sustained 2 weeks ago.  He states that the pain and erythema is getting worse.  He states that he initially cut his leg on the corner of a car door.  The metal was clean.  He reports some purulent and serosanguineous drainage.  No fevers, body aches, lower extremity edema.  He has tried ibuprofen, cleaning it with peroxide x1.  No alleviating factors.  Symptoms are worse with elevation.  States that he is having a hard time sleeping secondary to the pain.  His tetanus is not up-to-date.  He has a past medical history of chronic kidney disease stage IIIa, hypertension, sensorineural hearing loss, dementia.  No history of MRSA, diabetes.  PMD:Lee, Joylene Igo, MD  All history obtained through ASL language line.   Past Medical History:  Diagnosis Date   Hearing loss of both ears    Kidney disease    Memory loss, short term     History reviewed. No pertinent surgical history.  History reviewed. No pertinent family history.  Social History   Tobacco Use   Smoking status: Never    Passive exposure: Never   Smokeless tobacco: Never  Substance Use Topics   Alcohol use: Not Currently   Drug use: Never    No current facility-administered medications for this encounter.  Current Outpatient Medications:    chlorhexidine (HIBICLENS) 4 % external liquid, Apply topically daily as needed. Dilute 10-15 mL in water, Use daily when bathing for 1-2 weeks, Disp: 120 mL, Rfl: 0   doxycycline (VIBRAMYCIN) 100 MG capsule, Take 1 capsule (100 mg total) by mouth 2 (two) times daily for 7 days., Disp: 14 capsule, Rfl: 0   aspirin 81 MG EC tablet, Take by mouth., Disp: , Rfl:    atenolol (TENORMIN) 25 MG tablet, Take by mouth., Disp: , Rfl:    chlorhexidine (PERIDEX) 0.12 % solution, , Disp: , Rfl:    citric acid-potassium citrate (POLYCITRA) 1100-334 MG/5ML solution, Take by  mouth., Disp: , Rfl:    donepezil (ARICEPT ODT) 10 MG disintegrating tablet, Take by mouth., Disp: , Rfl:    lisinopril (ZESTRIL) 10 MG tablet, Take 1 tablet by mouth daily., Disp: , Rfl:    metoprolol succinate (TOPROL-XL) 25 MG 24 hr tablet, Take by mouth., Disp: , Rfl:    metroNIDAZOLE (FLAGYL) 500 MG tablet, Take by mouth., Disp: , Rfl:    mirabegron ER (MYRBETRIQ) 50 MG TB24 tablet, Take by mouth., Disp: , Rfl:    Multiple Vitamin (MULTIVITAMIN) capsule, Take 1 capsule by mouth daily., Disp: , Rfl:    predniSONE (DELTASONE) 10 MG tablet, Take by mouth., Disp: , Rfl:    Sodium Citrate Anhydrous POWD, 1/4 tsp in 8 oz water tid, Disp: , Rfl:    tamsulosin (FLOMAX) 0.4 MG CAPS capsule, Take 0.4 mg by mouth daily., Disp: , Rfl:    traMADol (ULTRAM) 50 MG tablet, 1-2 tablets every 8 hours as needed for pain., Disp: , Rfl:   Allergies  Allergen Reactions   Sulfur Rash   Latex    Penicillins Rash     ROS  As noted in HPI.   Physical Exam  BP 107/73 (BP Location: Left Arm)   Pulse 96   Temp 98.3 F (36.8 C) (Oral)   Resp 18   Ht 5\' 11"  (1.803 m)   Wt 95.3 kg   SpO2 96%  BMI 29.29 kg/m   Constitutional: Well developed, well nourished, no acute distress Eyes:  EOMI, conjunctiva normal bilaterally HENT: Normocephalic, atraumatic,mucus membranes moist Respiratory: Normal inspiratory effort Cardiovascular: Normal rate GI: nondistended skin: 9 x 8 cm tender area of erythema surrounding a 4 x 4.5 U-shaped laceration left lower extremity.  No expressible purulent drainage.  No swelling.  Marked area of erythema with a marker for reference.   Musculoskeletal: no deformities Neurologic: Alert & oriented x 3, no focal neuro deficits Psychiatric: Speech and behavior appropriate   ED Course   Medications  Tdap (BOOSTRIX) injection 0.5 mL (0.5 mLs Intramuscular Given 10/31/20 1011)    Orders Placed This Encounter  Procedures   DG Tibia/Fibula Left    Standing Status:    Standing    Number of Occurrences:   1    Order Specific Question:   Reason for Exam (SYMPTOM  OR DIAGNOSIS REQUIRED)    Answer:   lac 2 weeks ago r/o osteomyelitis   AMB referral to wound care center    Referral Priority:   Routine    Referral Type:   Consultation    Referral Reason:   Specialty Services Required    Number of Visits Requested:   1    No results found for this or any previous visit (from the past 24 hour(s)). DG Tibia/Fibula Left  Result Date: 10/31/2020 CLINICAL DATA:  lac 2 weeks ago r/o osteomyelitis EXAM: LEFT TIBIA AND FIBULA - 2 VIEW COMPARISON:  September 24, 2006 FINDINGS: Osteopenia. No acute fracture or dislocation. Joint spaces and alignment are maintained. Enthesophyte of the quadriceps tendon insertion on the patella. No area of erosion or osseous destruction. No unexpected radiopaque foreign body. Vascular calcifications. Soft tissue irregularity of the anterior mid to lower calf. IMPRESSION: No radiographic evidence of osteomyelitis. Electronically Signed   By: Valentino Saxon MD   On: 10/31/2020 10:42    ED Clinical Impression  1. Laceration of left lower extremity, initial encounter      ED Assessment/Plan  Checking tib-fib rule out osteomyelitis.  Reviewed imaging independently.  Osteopenia.  No radiographic evidence of osteomyelitis.  No fracture.  See radiology report for full details.  No evidence of osteomyelitis on x-ray.  Will send home with doxycycline for 7 days, Hibiclens.  Tylenol as needed for pain.  Will update tetanus today.  Will provide primary care list for ongoing care and refer to wound clinic.  Using the language line, explained imaging, medical decision making, treatment plan and plan for follow-up with patient. Also provided written instructions regarding imaging, medical decision making, treatment plan and plan for follow-up with patient.  Patient agrees with plan.    Spent over 45 minutes in the care of this patient.  Meds  ordered this encounter  Medications   Tdap (BOOSTRIX) injection 0.5 mL   chlorhexidine (HIBICLENS) 4 % external liquid    Sig: Apply topically daily as needed. Dilute 10-15 mL in water, Use daily when bathing for 1-2 weeks    Dispense:  120 mL    Refill:  0   doxycycline (VIBRAMYCIN) 100 MG capsule    Sig: Take 1 capsule (100 mg total) by mouth 2 (two) times daily for 7 days.    Dispense:  14 capsule    Refill:  0      *This clinic note was created using [employer]. Therefore, there may be occasional mistakes despite careful proofreading.  ?    Melynda Ripple, MD 11/01/20 1056  Melynda Ripple, MD 11/01/20 [phone]

## 2020-10-31 NOTE — Discharge Instructions (Addendum)
Your x-ray was negative for any signs of a bone infection or fracture.  Keep this clean with Hibiclens soap and water.  Finish doxycycline, even if you feels better.  Keep it covered.  Follow-up with a primary care provider of your choice for ongoing care.  See the list that we gave you.  I am also placing referral to the wound care clinic, as this will take some time to heal and I would like to have somebody follow this.  Take 1000 mg of Tylenol 3-4 times a day as needed for pain.  Go immediately to the ER for fevers above 100.4, altered mental status, pain not controlled with the Tylenol, or for any other concerns.

## 2020-11-03 DIAGNOSIS — R413 Other amnesia: Secondary | ICD-10-CM | POA: Diagnosis not present

## 2020-11-03 DIAGNOSIS — R9082 White matter disease, unspecified: Secondary | ICD-10-CM | POA: Diagnosis not present

## 2020-11-20 DIAGNOSIS — F17200 Nicotine dependence, unspecified, uncomplicated: Secondary | ICD-10-CM | POA: Diagnosis not present

## 2020-11-20 DIAGNOSIS — E669 Obesity, unspecified: Secondary | ICD-10-CM | POA: Diagnosis not present

## 2020-11-20 DIAGNOSIS — I471 Supraventricular tachycardia: Secondary | ICD-10-CM | POA: Diagnosis not present

## 2020-11-20 DIAGNOSIS — H9193 Unspecified hearing loss, bilateral: Secondary | ICD-10-CM | POA: Diagnosis not present

## 2020-11-20 DIAGNOSIS — E8881 Metabolic syndrome: Secondary | ICD-10-CM | POA: Diagnosis not present

## 2020-11-20 DIAGNOSIS — Z6831 Body mass index (BMI) 31.0-31.9, adult: Secondary | ICD-10-CM | POA: Diagnosis not present

## 2020-11-20 DIAGNOSIS — I10 Essential (primary) hypertension: Secondary | ICD-10-CM | POA: Diagnosis not present

## 2020-11-20 DIAGNOSIS — E7800 Pure hypercholesterolemia, unspecified: Secondary | ICD-10-CM | POA: Diagnosis not present

## 2020-11-20 DIAGNOSIS — L0390 Cellulitis, unspecified: Secondary | ICD-10-CM | POA: Diagnosis not present

## 2020-11-20 DIAGNOSIS — J449 Chronic obstructive pulmonary disease, unspecified: Secondary | ICD-10-CM | POA: Diagnosis not present

## 2020-11-20 DIAGNOSIS — N189 Chronic kidney disease, unspecified: Secondary | ICD-10-CM | POA: Diagnosis not present

## 2020-11-20 DIAGNOSIS — M109 Gout, unspecified: Secondary | ICD-10-CM | POA: Diagnosis not present

## 2020-11-25 DIAGNOSIS — M19032 Primary osteoarthritis, left wrist: Secondary | ICD-10-CM | POA: Diagnosis not present

## 2020-11-25 DIAGNOSIS — M25532 Pain in left wrist: Secondary | ICD-10-CM | POA: Diagnosis not present

## 2020-12-31 DIAGNOSIS — M21612 Bunion of left foot: Secondary | ICD-10-CM | POA: Diagnosis not present

## 2020-12-31 DIAGNOSIS — M21611 Bunion of right foot: Secondary | ICD-10-CM | POA: Diagnosis not present

## 2020-12-31 DIAGNOSIS — L301 Dyshidrosis [pompholyx]: Secondary | ICD-10-CM | POA: Diagnosis not present

## 2021-02-07 DIAGNOSIS — G3184 Mild cognitive impairment, so stated: Secondary | ICD-10-CM | POA: Diagnosis not present

## 2021-02-07 DIAGNOSIS — F320 Major depressive disorder, single episode, mild: Secondary | ICD-10-CM | POA: Diagnosis not present

## 2021-02-23 DIAGNOSIS — Z20822 Contact with and (suspected) exposure to covid-19: Secondary | ICD-10-CM | POA: Diagnosis not present

## 2021-02-23 DIAGNOSIS — J00 Acute nasopharyngitis [common cold]: Secondary | ICD-10-CM | POA: Diagnosis not present

## 2021-02-25 DIAGNOSIS — Z6828 Body mass index (BMI) 28.0-28.9, adult: Secondary | ICD-10-CM | POA: Diagnosis not present

## 2021-02-25 DIAGNOSIS — J069 Acute upper respiratory infection, unspecified: Secondary | ICD-10-CM | POA: Diagnosis not present

## 2021-02-25 DIAGNOSIS — I10 Essential (primary) hypertension: Secondary | ICD-10-CM | POA: Diagnosis not present

## 2021-02-25 DIAGNOSIS — J449 Chronic obstructive pulmonary disease, unspecified: Secondary | ICD-10-CM | POA: Diagnosis not present

## 2021-02-28 DIAGNOSIS — Z23 Encounter for immunization: Secondary | ICD-10-CM | POA: Diagnosis not present

## 2021-04-06 DIAGNOSIS — R059 Cough, unspecified: Secondary | ICD-10-CM | POA: Diagnosis not present

## 2021-04-06 DIAGNOSIS — J208 Acute bronchitis due to other specified organisms: Secondary | ICD-10-CM | POA: Diagnosis not present

## 2021-04-06 DIAGNOSIS — Z20822 Contact with and (suspected) exposure to covid-19: Secondary | ICD-10-CM | POA: Diagnosis not present

## 2021-04-11 DIAGNOSIS — H52223 Regular astigmatism, bilateral: Secondary | ICD-10-CM | POA: Diagnosis not present

## 2021-04-11 DIAGNOSIS — H25013 Cortical age-related cataract, bilateral: Secondary | ICD-10-CM | POA: Diagnosis not present

## 2021-04-11 DIAGNOSIS — H5203 Hypermetropia, bilateral: Secondary | ICD-10-CM | POA: Diagnosis not present

## 2021-04-18 DIAGNOSIS — N200 Calculus of kidney: Secondary | ICD-10-CM | POA: Diagnosis not present

## 2021-04-18 DIAGNOSIS — H903 Sensorineural hearing loss, bilateral: Secondary | ICD-10-CM | POA: Diagnosis not present

## 2021-04-18 DIAGNOSIS — N183 Chronic kidney disease, stage 3 unspecified: Secondary | ICD-10-CM | POA: Diagnosis not present

## 2021-04-18 DIAGNOSIS — E876 Hypokalemia: Secondary | ICD-10-CM | POA: Diagnosis not present

## 2021-04-18 DIAGNOSIS — I10 Essential (primary) hypertension: Secondary | ICD-10-CM | POA: Diagnosis not present

## 2021-04-18 DIAGNOSIS — I129 Hypertensive chronic kidney disease with stage 1 through stage 4 chronic kidney disease, or unspecified chronic kidney disease: Secondary | ICD-10-CM | POA: Diagnosis not present

## 2021-04-18 DIAGNOSIS — N2589 Other disorders resulting from impaired renal tubular function: Secondary | ICD-10-CM | POA: Diagnosis not present

## 2021-04-18 DIAGNOSIS — N1831 Chronic kidney disease, stage 3a: Secondary | ICD-10-CM | POA: Diagnosis not present

## 2021-05-06 DIAGNOSIS — Z043 Encounter for examination and observation following other accident: Secondary | ICD-10-CM | POA: Diagnosis not present

## 2021-05-06 DIAGNOSIS — Z880 Allergy status to penicillin: Secondary | ICD-10-CM | POA: Diagnosis not present

## 2021-05-06 DIAGNOSIS — Z20822 Contact with and (suspected) exposure to covid-19: Secondary | ICD-10-CM | POA: Diagnosis not present

## 2021-05-06 DIAGNOSIS — A419 Sepsis, unspecified organism: Secondary | ICD-10-CM | POA: Diagnosis not present

## 2021-05-06 DIAGNOSIS — G8911 Acute pain due to trauma: Secondary | ICD-10-CM | POA: Diagnosis not present

## 2021-05-06 DIAGNOSIS — E876 Hypokalemia: Secondary | ICD-10-CM | POA: Diagnosis not present

## 2021-05-06 DIAGNOSIS — R579 Shock, unspecified: Secondary | ICD-10-CM | POA: Diagnosis not present

## 2021-05-06 DIAGNOSIS — Z23 Encounter for immunization: Secondary | ICD-10-CM | POA: Diagnosis not present

## 2021-05-06 DIAGNOSIS — K5730 Diverticulosis of large intestine without perforation or abscess without bleeding: Secondary | ICD-10-CM | POA: Diagnosis not present

## 2021-05-06 DIAGNOSIS — J441 Chronic obstructive pulmonary disease with (acute) exacerbation: Secondary | ICD-10-CM | POA: Diagnosis not present

## 2021-05-06 DIAGNOSIS — J984 Other disorders of lung: Secondary | ICD-10-CM | POA: Diagnosis not present

## 2021-05-06 DIAGNOSIS — Z91040 Latex allergy status: Secondary | ICD-10-CM | POA: Diagnosis not present

## 2021-05-06 DIAGNOSIS — I4891 Unspecified atrial fibrillation: Secondary | ICD-10-CM | POA: Diagnosis not present

## 2021-05-06 DIAGNOSIS — K4020 Bilateral inguinal hernia, without obstruction or gangrene, not specified as recurrent: Secondary | ICD-10-CM | POA: Diagnosis not present

## 2021-05-06 DIAGNOSIS — N179 Acute kidney failure, unspecified: Secondary | ICD-10-CM | POA: Diagnosis not present

## 2021-05-06 DIAGNOSIS — Z9181 History of falling: Secondary | ICD-10-CM | POA: Diagnosis not present

## 2021-05-06 DIAGNOSIS — Z7982 Long term (current) use of aspirin: Secondary | ICD-10-CM | POA: Diagnosis not present

## 2021-05-06 DIAGNOSIS — I10 Essential (primary) hypertension: Secondary | ICD-10-CM | POA: Diagnosis not present

## 2021-05-06 DIAGNOSIS — I120 Hypertensive chronic kidney disease with stage 5 chronic kidney disease or end stage renal disease: Secondary | ICD-10-CM | POA: Diagnosis not present

## 2021-05-06 DIAGNOSIS — N2889 Other specified disorders of kidney and ureter: Secondary | ICD-10-CM | POA: Diagnosis not present

## 2021-05-06 DIAGNOSIS — E860 Dehydration: Secondary | ICD-10-CM | POA: Diagnosis not present

## 2021-05-06 DIAGNOSIS — I959 Hypotension, unspecified: Secondary | ICD-10-CM | POA: Diagnosis not present

## 2021-05-06 DIAGNOSIS — I517 Cardiomegaly: Secondary | ICD-10-CM | POA: Diagnosis not present

## 2021-05-06 DIAGNOSIS — R55 Syncope and collapse: Secondary | ICD-10-CM | POA: Diagnosis not present

## 2021-05-06 DIAGNOSIS — Z452 Encounter for adjustment and management of vascular access device: Secondary | ICD-10-CM | POA: Diagnosis not present

## 2021-05-06 DIAGNOSIS — R918 Other nonspecific abnormal finding of lung field: Secondary | ICD-10-CM | POA: Diagnosis not present

## 2021-05-06 DIAGNOSIS — E872 Acidosis, unspecified: Secondary | ICD-10-CM | POA: Diagnosis not present

## 2021-05-06 DIAGNOSIS — R079 Chest pain, unspecified: Secondary | ICD-10-CM | POA: Diagnosis not present

## 2021-05-06 DIAGNOSIS — F0390 Unspecified dementia without behavioral disturbance: Secondary | ICD-10-CM | POA: Diagnosis not present

## 2021-05-06 DIAGNOSIS — R6521 Severe sepsis with septic shock: Secondary | ICD-10-CM | POA: Diagnosis not present

## 2021-05-06 DIAGNOSIS — Z87891 Personal history of nicotine dependence: Secondary | ICD-10-CM | POA: Diagnosis not present

## 2021-05-06 DIAGNOSIS — M109 Gout, unspecified: Secondary | ICD-10-CM | POA: Diagnosis not present

## 2021-05-06 DIAGNOSIS — J9601 Acute respiratory failure with hypoxia: Secondary | ICD-10-CM | POA: Diagnosis not present

## 2021-05-06 DIAGNOSIS — N185 Chronic kidney disease, stage 5: Secondary | ICD-10-CM | POA: Diagnosis not present

## 2021-05-06 DIAGNOSIS — R0789 Other chest pain: Secondary | ICD-10-CM | POA: Diagnosis not present

## 2021-05-06 DIAGNOSIS — J439 Emphysema, unspecified: Secondary | ICD-10-CM | POA: Diagnosis not present

## 2021-05-06 DIAGNOSIS — R911 Solitary pulmonary nodule: Secondary | ICD-10-CM | POA: Diagnosis not present

## 2021-05-06 DIAGNOSIS — E861 Hypovolemia: Secondary | ICD-10-CM | POA: Diagnosis not present

## 2021-05-06 DIAGNOSIS — R296 Repeated falls: Secondary | ICD-10-CM | POA: Diagnosis not present

## 2021-05-06 DIAGNOSIS — R42 Dizziness and giddiness: Secondary | ICD-10-CM | POA: Diagnosis not present

## 2021-05-06 DIAGNOSIS — N3000 Acute cystitis without hematuria: Secondary | ICD-10-CM | POA: Diagnosis not present

## 2021-05-12 DIAGNOSIS — F17200 Nicotine dependence, unspecified, uncomplicated: Secondary | ICD-10-CM | POA: Diagnosis not present

## 2021-05-12 DIAGNOSIS — Z6828 Body mass index (BMI) 28.0-28.9, adult: Secondary | ICD-10-CM | POA: Diagnosis not present

## 2021-05-12 DIAGNOSIS — A419 Sepsis, unspecified organism: Secondary | ICD-10-CM | POA: Diagnosis not present

## 2021-05-12 DIAGNOSIS — J449 Chronic obstructive pulmonary disease, unspecified: Secondary | ICD-10-CM | POA: Diagnosis not present

## 2021-05-12 DIAGNOSIS — H9193 Unspecified hearing loss, bilateral: Secondary | ICD-10-CM | POA: Diagnosis not present

## 2021-05-12 DIAGNOSIS — I471 Supraventricular tachycardia: Secondary | ICD-10-CM | POA: Diagnosis not present

## 2021-05-12 DIAGNOSIS — N189 Chronic kidney disease, unspecified: Secondary | ICD-10-CM | POA: Diagnosis not present

## 2021-05-12 DIAGNOSIS — E8881 Metabolic syndrome: Secondary | ICD-10-CM | POA: Diagnosis not present

## 2021-05-12 DIAGNOSIS — E669 Obesity, unspecified: Secondary | ICD-10-CM | POA: Diagnosis not present

## 2021-05-12 DIAGNOSIS — E663 Overweight: Secondary | ICD-10-CM | POA: Diagnosis not present

## 2021-05-12 DIAGNOSIS — M109 Gout, unspecified: Secondary | ICD-10-CM | POA: Diagnosis not present

## 2021-05-12 DIAGNOSIS — E2740 Unspecified adrenocortical insufficiency: Secondary | ICD-10-CM | POA: Diagnosis not present

## 2021-05-16 DIAGNOSIS — Z87891 Personal history of nicotine dependence: Secondary | ICD-10-CM | POA: Diagnosis not present

## 2021-05-16 DIAGNOSIS — M109 Gout, unspecified: Secondary | ICD-10-CM | POA: Diagnosis not present

## 2021-05-16 DIAGNOSIS — Z7951 Long term (current) use of inhaled steroids: Secondary | ICD-10-CM | POA: Diagnosis not present

## 2021-05-16 DIAGNOSIS — N185 Chronic kidney disease, stage 5: Secondary | ICD-10-CM | POA: Diagnosis not present

## 2021-05-16 DIAGNOSIS — F0390 Unspecified dementia without behavioral disturbance: Secondary | ICD-10-CM | POA: Diagnosis not present

## 2021-05-16 DIAGNOSIS — Z9181 History of falling: Secondary | ICD-10-CM | POA: Diagnosis not present

## 2021-05-16 DIAGNOSIS — I959 Hypotension, unspecified: Secondary | ICD-10-CM | POA: Diagnosis not present

## 2021-05-16 DIAGNOSIS — N179 Acute kidney failure, unspecified: Secondary | ICD-10-CM | POA: Diagnosis not present

## 2021-05-16 DIAGNOSIS — J439 Emphysema, unspecified: Secondary | ICD-10-CM | POA: Diagnosis not present

## 2021-05-16 DIAGNOSIS — J9601 Acute respiratory failure with hypoxia: Secondary | ICD-10-CM | POA: Diagnosis not present

## 2021-05-16 DIAGNOSIS — I120 Hypertensive chronic kidney disease with stage 5 chronic kidney disease or end stage renal disease: Secondary | ICD-10-CM | POA: Diagnosis not present

## 2021-05-19 DIAGNOSIS — N185 Chronic kidney disease, stage 5: Secondary | ICD-10-CM | POA: Diagnosis not present

## 2021-05-19 DIAGNOSIS — F0390 Unspecified dementia without behavioral disturbance: Secondary | ICD-10-CM | POA: Diagnosis not present

## 2021-05-19 DIAGNOSIS — M109 Gout, unspecified: Secondary | ICD-10-CM | POA: Diagnosis not present

## 2021-05-19 DIAGNOSIS — N179 Acute kidney failure, unspecified: Secondary | ICD-10-CM | POA: Diagnosis not present

## 2021-05-19 DIAGNOSIS — I120 Hypertensive chronic kidney disease with stage 5 chronic kidney disease or end stage renal disease: Secondary | ICD-10-CM | POA: Diagnosis not present

## 2021-05-19 DIAGNOSIS — J439 Emphysema, unspecified: Secondary | ICD-10-CM | POA: Diagnosis not present

## 2021-05-19 DIAGNOSIS — J9601 Acute respiratory failure with hypoxia: Secondary | ICD-10-CM | POA: Diagnosis not present

## 2021-05-20 DIAGNOSIS — J449 Chronic obstructive pulmonary disease, unspecified: Secondary | ICD-10-CM | POA: Diagnosis not present

## 2021-05-20 DIAGNOSIS — M109 Gout, unspecified: Secondary | ICD-10-CM | POA: Diagnosis not present

## 2021-05-20 DIAGNOSIS — H9193 Unspecified hearing loss, bilateral: Secondary | ICD-10-CM | POA: Diagnosis not present

## 2021-05-20 DIAGNOSIS — E8881 Metabolic syndrome: Secondary | ICD-10-CM | POA: Diagnosis not present

## 2021-05-20 DIAGNOSIS — I471 Supraventricular tachycardia: Secondary | ICD-10-CM | POA: Diagnosis not present

## 2021-05-20 DIAGNOSIS — E2740 Unspecified adrenocortical insufficiency: Secondary | ICD-10-CM | POA: Diagnosis not present

## 2021-05-20 DIAGNOSIS — N189 Chronic kidney disease, unspecified: Secondary | ICD-10-CM | POA: Diagnosis not present

## 2021-05-20 DIAGNOSIS — F17200 Nicotine dependence, unspecified, uncomplicated: Secondary | ICD-10-CM | POA: Diagnosis not present

## 2021-05-20 DIAGNOSIS — E669 Obesity, unspecified: Secondary | ICD-10-CM | POA: Diagnosis not present

## 2021-05-20 DIAGNOSIS — Z6828 Body mass index (BMI) 28.0-28.9, adult: Secondary | ICD-10-CM | POA: Diagnosis not present

## 2021-05-20 DIAGNOSIS — L0390 Cellulitis, unspecified: Secondary | ICD-10-CM | POA: Diagnosis not present

## 2021-05-23 DIAGNOSIS — J9601 Acute respiratory failure with hypoxia: Secondary | ICD-10-CM | POA: Diagnosis not present

## 2021-05-23 DIAGNOSIS — J439 Emphysema, unspecified: Secondary | ICD-10-CM | POA: Diagnosis not present

## 2021-05-23 DIAGNOSIS — N179 Acute kidney failure, unspecified: Secondary | ICD-10-CM | POA: Diagnosis not present

## 2021-05-23 DIAGNOSIS — M109 Gout, unspecified: Secondary | ICD-10-CM | POA: Diagnosis not present

## 2021-05-23 DIAGNOSIS — I120 Hypertensive chronic kidney disease with stage 5 chronic kidney disease or end stage renal disease: Secondary | ICD-10-CM | POA: Diagnosis not present

## 2021-05-23 DIAGNOSIS — F0390 Unspecified dementia without behavioral disturbance: Secondary | ICD-10-CM | POA: Diagnosis not present

## 2021-05-23 DIAGNOSIS — N185 Chronic kidney disease, stage 5: Secondary | ICD-10-CM | POA: Diagnosis not present

## 2021-05-26 DIAGNOSIS — J439 Emphysema, unspecified: Secondary | ICD-10-CM | POA: Diagnosis not present

## 2021-05-26 DIAGNOSIS — I120 Hypertensive chronic kidney disease with stage 5 chronic kidney disease or end stage renal disease: Secondary | ICD-10-CM | POA: Diagnosis not present

## 2021-05-26 DIAGNOSIS — N185 Chronic kidney disease, stage 5: Secondary | ICD-10-CM | POA: Diagnosis not present

## 2021-05-26 DIAGNOSIS — J9601 Acute respiratory failure with hypoxia: Secondary | ICD-10-CM | POA: Diagnosis not present

## 2021-05-26 DIAGNOSIS — N179 Acute kidney failure, unspecified: Secondary | ICD-10-CM | POA: Diagnosis not present

## 2021-05-26 DIAGNOSIS — F0390 Unspecified dementia without behavioral disturbance: Secondary | ICD-10-CM | POA: Diagnosis not present

## 2021-05-26 DIAGNOSIS — M109 Gout, unspecified: Secondary | ICD-10-CM | POA: Diagnosis not present

## 2021-05-27 DIAGNOSIS — L0390 Cellulitis, unspecified: Secondary | ICD-10-CM | POA: Diagnosis not present

## 2021-05-27 DIAGNOSIS — E7800 Pure hypercholesterolemia, unspecified: Secondary | ICD-10-CM | POA: Diagnosis not present

## 2021-05-27 DIAGNOSIS — I10 Essential (primary) hypertension: Secondary | ICD-10-CM | POA: Diagnosis not present

## 2021-05-27 DIAGNOSIS — E8881 Metabolic syndrome: Secondary | ICD-10-CM | POA: Diagnosis not present

## 2021-05-27 DIAGNOSIS — F17200 Nicotine dependence, unspecified, uncomplicated: Secondary | ICD-10-CM | POA: Diagnosis not present

## 2021-05-27 DIAGNOSIS — J449 Chronic obstructive pulmonary disease, unspecified: Secondary | ICD-10-CM | POA: Diagnosis not present

## 2021-05-27 DIAGNOSIS — H9193 Unspecified hearing loss, bilateral: Secondary | ICD-10-CM | POA: Diagnosis not present

## 2021-05-27 DIAGNOSIS — E2740 Unspecified adrenocortical insufficiency: Secondary | ICD-10-CM | POA: Diagnosis not present

## 2021-05-27 DIAGNOSIS — M109 Gout, unspecified: Secondary | ICD-10-CM | POA: Diagnosis not present

## 2021-05-27 DIAGNOSIS — E669 Obesity, unspecified: Secondary | ICD-10-CM | POA: Diagnosis not present

## 2021-05-27 DIAGNOSIS — I471 Supraventricular tachycardia: Secondary | ICD-10-CM | POA: Diagnosis not present

## 2021-05-27 DIAGNOSIS — N189 Chronic kidney disease, unspecified: Secondary | ICD-10-CM | POA: Diagnosis not present

## 2021-05-30 DIAGNOSIS — J9601 Acute respiratory failure with hypoxia: Secondary | ICD-10-CM | POA: Diagnosis not present

## 2021-05-30 DIAGNOSIS — N185 Chronic kidney disease, stage 5: Secondary | ICD-10-CM | POA: Diagnosis not present

## 2021-05-30 DIAGNOSIS — J439 Emphysema, unspecified: Secondary | ICD-10-CM | POA: Diagnosis not present

## 2021-05-30 DIAGNOSIS — M109 Gout, unspecified: Secondary | ICD-10-CM | POA: Diagnosis not present

## 2021-05-30 DIAGNOSIS — F0390 Unspecified dementia without behavioral disturbance: Secondary | ICD-10-CM | POA: Diagnosis not present

## 2021-05-30 DIAGNOSIS — N179 Acute kidney failure, unspecified: Secondary | ICD-10-CM | POA: Diagnosis not present

## 2021-05-30 DIAGNOSIS — I120 Hypertensive chronic kidney disease with stage 5 chronic kidney disease or end stage renal disease: Secondary | ICD-10-CM | POA: Diagnosis not present

## 2021-05-31 DIAGNOSIS — J439 Emphysema, unspecified: Secondary | ICD-10-CM | POA: Diagnosis not present

## 2021-05-31 DIAGNOSIS — L97519 Non-pressure chronic ulcer of other part of right foot with unspecified severity: Secondary | ICD-10-CM | POA: Diagnosis not present

## 2021-05-31 DIAGNOSIS — I10 Essential (primary) hypertension: Secondary | ICD-10-CM | POA: Diagnosis not present

## 2021-05-31 DIAGNOSIS — F0390 Unspecified dementia without behavioral disturbance: Secondary | ICD-10-CM | POA: Diagnosis not present

## 2021-05-31 DIAGNOSIS — E876 Hypokalemia: Secondary | ICD-10-CM | POA: Diagnosis not present

## 2021-05-31 DIAGNOSIS — Z87442 Personal history of urinary calculi: Secondary | ICD-10-CM | POA: Diagnosis not present

## 2021-05-31 DIAGNOSIS — M79671 Pain in right foot: Secondary | ICD-10-CM | POA: Diagnosis not present

## 2021-06-01 DIAGNOSIS — H02831 Dermatochalasis of right upper eyelid: Secondary | ICD-10-CM | POA: Diagnosis not present

## 2021-06-01 DIAGNOSIS — H02834 Dermatochalasis of left upper eyelid: Secondary | ICD-10-CM | POA: Diagnosis not present

## 2021-06-01 DIAGNOSIS — H25813 Combined forms of age-related cataract, bilateral: Secondary | ICD-10-CM | POA: Diagnosis not present

## 2021-06-01 DIAGNOSIS — D3132 Benign neoplasm of left choroid: Secondary | ICD-10-CM | POA: Diagnosis not present

## 2021-06-01 DIAGNOSIS — H179 Unspecified corneal scar and opacity: Secondary | ICD-10-CM | POA: Diagnosis not present

## 2021-06-01 DIAGNOSIS — H35363 Drusen (degenerative) of macula, bilateral: Secondary | ICD-10-CM | POA: Diagnosis not present

## 2021-06-01 DIAGNOSIS — H527 Unspecified disorder of refraction: Secondary | ICD-10-CM | POA: Diagnosis not present

## 2021-06-01 DIAGNOSIS — H52203 Unspecified astigmatism, bilateral: Secondary | ICD-10-CM | POA: Diagnosis not present

## 2021-06-01 DIAGNOSIS — H18503 Unspecified hereditary corneal dystrophies, bilateral: Secondary | ICD-10-CM | POA: Diagnosis not present

## 2021-06-02 DIAGNOSIS — M109 Gout, unspecified: Secondary | ICD-10-CM | POA: Diagnosis not present

## 2021-06-02 DIAGNOSIS — I120 Hypertensive chronic kidney disease with stage 5 chronic kidney disease or end stage renal disease: Secondary | ICD-10-CM | POA: Diagnosis not present

## 2021-06-02 DIAGNOSIS — N179 Acute kidney failure, unspecified: Secondary | ICD-10-CM | POA: Diagnosis not present

## 2021-06-02 DIAGNOSIS — N185 Chronic kidney disease, stage 5: Secondary | ICD-10-CM | POA: Diagnosis not present

## 2021-06-02 DIAGNOSIS — J439 Emphysema, unspecified: Secondary | ICD-10-CM | POA: Diagnosis not present

## 2021-06-02 DIAGNOSIS — F0390 Unspecified dementia without behavioral disturbance: Secondary | ICD-10-CM | POA: Diagnosis not present

## 2021-06-02 DIAGNOSIS — J9601 Acute respiratory failure with hypoxia: Secondary | ICD-10-CM | POA: Diagnosis not present

## 2021-06-03 DIAGNOSIS — I10 Essential (primary) hypertension: Secondary | ICD-10-CM | POA: Diagnosis not present

## 2021-06-03 DIAGNOSIS — R911 Solitary pulmonary nodule: Secondary | ICD-10-CM | POA: Diagnosis not present

## 2021-06-03 DIAGNOSIS — Z122 Encounter for screening for malignant neoplasm of respiratory organs: Secondary | ICD-10-CM | POA: Diagnosis not present

## 2021-06-03 DIAGNOSIS — F0390 Unspecified dementia without behavioral disturbance: Secondary | ICD-10-CM | POA: Diagnosis not present

## 2021-06-03 DIAGNOSIS — J439 Emphysema, unspecified: Secondary | ICD-10-CM | POA: Diagnosis not present

## 2021-06-03 DIAGNOSIS — J90 Pleural effusion, not elsewhere classified: Secondary | ICD-10-CM | POA: Diagnosis not present

## 2021-06-03 DIAGNOSIS — R918 Other nonspecific abnormal finding of lung field: Secondary | ICD-10-CM | POA: Diagnosis not present

## 2021-06-03 DIAGNOSIS — F17211 Nicotine dependence, cigarettes, in remission: Secondary | ICD-10-CM | POA: Diagnosis not present

## 2021-06-09 DIAGNOSIS — I120 Hypertensive chronic kidney disease with stage 5 chronic kidney disease or end stage renal disease: Secondary | ICD-10-CM | POA: Diagnosis not present

## 2021-06-09 DIAGNOSIS — N179 Acute kidney failure, unspecified: Secondary | ICD-10-CM | POA: Diagnosis not present

## 2021-06-09 DIAGNOSIS — M109 Gout, unspecified: Secondary | ICD-10-CM | POA: Diagnosis not present

## 2021-06-09 DIAGNOSIS — F0390 Unspecified dementia without behavioral disturbance: Secondary | ICD-10-CM | POA: Diagnosis not present

## 2021-06-09 DIAGNOSIS — J9601 Acute respiratory failure with hypoxia: Secondary | ICD-10-CM | POA: Diagnosis not present

## 2021-06-09 DIAGNOSIS — N185 Chronic kidney disease, stage 5: Secondary | ICD-10-CM | POA: Diagnosis not present

## 2021-06-09 DIAGNOSIS — J439 Emphysema, unspecified: Secondary | ICD-10-CM | POA: Diagnosis not present

## 2021-06-14 DIAGNOSIS — H278 Other specified disorders of lens: Secondary | ICD-10-CM | POA: Diagnosis not present

## 2021-06-14 DIAGNOSIS — Z880 Allergy status to penicillin: Secondary | ICD-10-CM | POA: Diagnosis not present

## 2021-06-14 DIAGNOSIS — I10 Essential (primary) hypertension: Secondary | ICD-10-CM | POA: Diagnosis not present

## 2021-06-14 DIAGNOSIS — J449 Chronic obstructive pulmonary disease, unspecified: Secondary | ICD-10-CM | POA: Diagnosis not present

## 2021-06-14 DIAGNOSIS — Z888 Allergy status to other drugs, medicaments and biological substances status: Secondary | ICD-10-CM | POA: Diagnosis not present

## 2021-06-14 DIAGNOSIS — Z882 Allergy status to sulfonamides status: Secondary | ICD-10-CM | POA: Diagnosis not present

## 2021-06-14 DIAGNOSIS — H25812 Combined forms of age-related cataract, left eye: Secondary | ICD-10-CM | POA: Diagnosis not present

## 2021-06-15 DIAGNOSIS — M109 Gout, unspecified: Secondary | ICD-10-CM | POA: Diagnosis not present

## 2021-06-15 DIAGNOSIS — J9601 Acute respiratory failure with hypoxia: Secondary | ICD-10-CM | POA: Diagnosis not present

## 2021-06-15 DIAGNOSIS — Z7951 Long term (current) use of inhaled steroids: Secondary | ICD-10-CM | POA: Diagnosis not present

## 2021-06-15 DIAGNOSIS — Z87891 Personal history of nicotine dependence: Secondary | ICD-10-CM | POA: Diagnosis not present

## 2021-06-15 DIAGNOSIS — N179 Acute kidney failure, unspecified: Secondary | ICD-10-CM | POA: Diagnosis not present

## 2021-06-15 DIAGNOSIS — I959 Hypotension, unspecified: Secondary | ICD-10-CM | POA: Diagnosis not present

## 2021-06-15 DIAGNOSIS — Z9181 History of falling: Secondary | ICD-10-CM | POA: Diagnosis not present

## 2021-06-15 DIAGNOSIS — N185 Chronic kidney disease, stage 5: Secondary | ICD-10-CM | POA: Diagnosis not present

## 2021-06-15 DIAGNOSIS — F0390 Unspecified dementia without behavioral disturbance: Secondary | ICD-10-CM | POA: Diagnosis not present

## 2021-06-15 DIAGNOSIS — J439 Emphysema, unspecified: Secondary | ICD-10-CM | POA: Diagnosis not present

## 2021-06-15 DIAGNOSIS — I120 Hypertensive chronic kidney disease with stage 5 chronic kidney disease or end stage renal disease: Secondary | ICD-10-CM | POA: Diagnosis not present

## 2021-06-16 DIAGNOSIS — N185 Chronic kidney disease, stage 5: Secondary | ICD-10-CM | POA: Diagnosis not present

## 2021-06-16 DIAGNOSIS — N179 Acute kidney failure, unspecified: Secondary | ICD-10-CM | POA: Diagnosis not present

## 2021-06-16 DIAGNOSIS — I120 Hypertensive chronic kidney disease with stage 5 chronic kidney disease or end stage renal disease: Secondary | ICD-10-CM | POA: Diagnosis not present

## 2021-06-16 DIAGNOSIS — J439 Emphysema, unspecified: Secondary | ICD-10-CM | POA: Diagnosis not present

## 2021-06-16 DIAGNOSIS — F0390 Unspecified dementia without behavioral disturbance: Secondary | ICD-10-CM | POA: Diagnosis not present

## 2021-06-16 DIAGNOSIS — M109 Gout, unspecified: Secondary | ICD-10-CM | POA: Diagnosis not present

## 2021-06-21 DIAGNOSIS — Z880 Allergy status to penicillin: Secondary | ICD-10-CM | POA: Diagnosis not present

## 2021-06-21 DIAGNOSIS — J449 Chronic obstructive pulmonary disease, unspecified: Secondary | ICD-10-CM | POA: Diagnosis not present

## 2021-06-21 DIAGNOSIS — Z91040 Latex allergy status: Secondary | ICD-10-CM | POA: Diagnosis not present

## 2021-06-21 DIAGNOSIS — Z87891 Personal history of nicotine dependence: Secondary | ICD-10-CM | POA: Diagnosis not present

## 2021-06-21 DIAGNOSIS — Z882 Allergy status to sulfonamides status: Secondary | ICD-10-CM | POA: Diagnosis not present

## 2021-06-21 DIAGNOSIS — H25811 Combined forms of age-related cataract, right eye: Secondary | ICD-10-CM | POA: Diagnosis not present

## 2021-06-21 DIAGNOSIS — H25813 Combined forms of age-related cataract, bilateral: Secondary | ICD-10-CM | POA: Diagnosis not present

## 2021-06-21 DIAGNOSIS — I10 Essential (primary) hypertension: Secondary | ICD-10-CM | POA: Diagnosis not present

## 2021-06-21 DIAGNOSIS — I129 Hypertensive chronic kidney disease with stage 1 through stage 4 chronic kidney disease, or unspecified chronic kidney disease: Secondary | ICD-10-CM | POA: Diagnosis not present

## 2021-06-21 DIAGNOSIS — Z79899 Other long term (current) drug therapy: Secondary | ICD-10-CM | POA: Diagnosis not present

## 2021-06-21 DIAGNOSIS — N189 Chronic kidney disease, unspecified: Secondary | ICD-10-CM | POA: Diagnosis not present

## 2021-06-21 DIAGNOSIS — F0390 Unspecified dementia without behavioral disturbance: Secondary | ICD-10-CM | POA: Diagnosis not present

## 2021-06-23 DIAGNOSIS — N185 Chronic kidney disease, stage 5: Secondary | ICD-10-CM | POA: Diagnosis not present

## 2021-06-23 DIAGNOSIS — N179 Acute kidney failure, unspecified: Secondary | ICD-10-CM | POA: Diagnosis not present

## 2021-06-23 DIAGNOSIS — F0390 Unspecified dementia without behavioral disturbance: Secondary | ICD-10-CM | POA: Diagnosis not present

## 2021-06-23 DIAGNOSIS — I120 Hypertensive chronic kidney disease with stage 5 chronic kidney disease or end stage renal disease: Secondary | ICD-10-CM | POA: Diagnosis not present

## 2021-06-23 DIAGNOSIS — J439 Emphysema, unspecified: Secondary | ICD-10-CM | POA: Diagnosis not present

## 2021-06-23 DIAGNOSIS — M109 Gout, unspecified: Secondary | ICD-10-CM | POA: Diagnosis not present

## 2021-06-27 DIAGNOSIS — J439 Emphysema, unspecified: Secondary | ICD-10-CM | POA: Diagnosis not present

## 2021-06-27 DIAGNOSIS — E876 Hypokalemia: Secondary | ICD-10-CM | POA: Diagnosis not present

## 2021-06-27 DIAGNOSIS — I499 Cardiac arrhythmia, unspecified: Secondary | ICD-10-CM | POA: Diagnosis not present

## 2021-06-27 DIAGNOSIS — R0602 Shortness of breath: Secondary | ICD-10-CM | POA: Diagnosis not present

## 2021-06-27 DIAGNOSIS — L97519 Non-pressure chronic ulcer of other part of right foot with unspecified severity: Secondary | ICD-10-CM | POA: Diagnosis not present

## 2021-06-27 DIAGNOSIS — I4819 Other persistent atrial fibrillation: Secondary | ICD-10-CM | POA: Diagnosis not present

## 2021-06-27 DIAGNOSIS — M7989 Other specified soft tissue disorders: Secondary | ICD-10-CM | POA: Diagnosis not present

## 2021-06-27 DIAGNOSIS — I10 Essential (primary) hypertension: Secondary | ICD-10-CM | POA: Diagnosis not present

## 2021-06-30 DIAGNOSIS — M109 Gout, unspecified: Secondary | ICD-10-CM | POA: Diagnosis not present

## 2021-06-30 DIAGNOSIS — I120 Hypertensive chronic kidney disease with stage 5 chronic kidney disease or end stage renal disease: Secondary | ICD-10-CM | POA: Diagnosis not present

## 2021-06-30 DIAGNOSIS — F0390 Unspecified dementia without behavioral disturbance: Secondary | ICD-10-CM | POA: Diagnosis not present

## 2021-06-30 DIAGNOSIS — N179 Acute kidney failure, unspecified: Secondary | ICD-10-CM | POA: Diagnosis not present

## 2021-06-30 DIAGNOSIS — N185 Chronic kidney disease, stage 5: Secondary | ICD-10-CM | POA: Diagnosis not present

## 2021-06-30 DIAGNOSIS — J439 Emphysema, unspecified: Secondary | ICD-10-CM | POA: Diagnosis not present

## 2021-07-08 DIAGNOSIS — M7989 Other specified soft tissue disorders: Secondary | ICD-10-CM | POA: Diagnosis not present

## 2021-07-08 DIAGNOSIS — I10 Essential (primary) hypertension: Secondary | ICD-10-CM | POA: Diagnosis not present

## 2021-07-08 DIAGNOSIS — E876 Hypokalemia: Secondary | ICD-10-CM | POA: Diagnosis not present

## 2021-07-29 DIAGNOSIS — I4819 Other persistent atrial fibrillation: Secondary | ICD-10-CM | POA: Diagnosis not present

## 2021-07-29 DIAGNOSIS — I10 Essential (primary) hypertension: Secondary | ICD-10-CM | POA: Diagnosis not present

## 2021-07-29 DIAGNOSIS — I499 Cardiac arrhythmia, unspecified: Secondary | ICD-10-CM | POA: Diagnosis not present

## 2021-07-29 DIAGNOSIS — E876 Hypokalemia: Secondary | ICD-10-CM | POA: Diagnosis not present

## 2021-08-04 DIAGNOSIS — J441 Chronic obstructive pulmonary disease with (acute) exacerbation: Secondary | ICD-10-CM | POA: Diagnosis not present

## 2021-08-04 DIAGNOSIS — I482 Chronic atrial fibrillation, unspecified: Secondary | ICD-10-CM | POA: Diagnosis not present

## 2021-08-04 DIAGNOSIS — I272 Pulmonary hypertension, unspecified: Secondary | ICD-10-CM | POA: Diagnosis not present

## 2021-08-04 DIAGNOSIS — J440 Chronic obstructive pulmonary disease with acute lower respiratory infection: Secondary | ICD-10-CM | POA: Diagnosis not present

## 2021-08-04 DIAGNOSIS — R911 Solitary pulmonary nodule: Secondary | ICD-10-CM | POA: Diagnosis not present

## 2021-08-04 DIAGNOSIS — R7989 Other specified abnormal findings of blood chemistry: Secondary | ICD-10-CM | POA: Diagnosis not present

## 2021-08-04 DIAGNOSIS — I089 Rheumatic multiple valve disease, unspecified: Secondary | ICD-10-CM | POA: Diagnosis not present

## 2021-08-04 DIAGNOSIS — F17211 Nicotine dependence, cigarettes, in remission: Secondary | ICD-10-CM | POA: Diagnosis not present

## 2021-08-04 DIAGNOSIS — I517 Cardiomegaly: Secondary | ICD-10-CM | POA: Diagnosis not present

## 2021-08-04 DIAGNOSIS — Z79899 Other long term (current) drug therapy: Secondary | ICD-10-CM | POA: Diagnosis not present

## 2021-08-04 DIAGNOSIS — N1832 Chronic kidney disease, stage 3b: Secondary | ICD-10-CM | POA: Diagnosis not present

## 2021-08-04 DIAGNOSIS — I480 Paroxysmal atrial fibrillation: Secondary | ICD-10-CM | POA: Diagnosis not present

## 2021-08-04 DIAGNOSIS — I10 Essential (primary) hypertension: Secondary | ICD-10-CM | POA: Diagnosis not present

## 2021-08-04 DIAGNOSIS — F0390 Unspecified dementia without behavioral disturbance: Secondary | ICD-10-CM | POA: Diagnosis not present

## 2021-08-04 DIAGNOSIS — J9602 Acute respiratory failure with hypercapnia: Secondary | ICD-10-CM | POA: Diagnosis not present

## 2021-08-04 DIAGNOSIS — I509 Heart failure, unspecified: Secondary | ICD-10-CM | POA: Diagnosis not present

## 2021-08-04 DIAGNOSIS — J90 Pleural effusion, not elsewhere classified: Secondary | ICD-10-CM | POA: Diagnosis not present

## 2021-08-04 DIAGNOSIS — Z91148 Patient's other noncompliance with medication regimen for other reason: Secondary | ICD-10-CM | POA: Diagnosis not present

## 2021-08-04 DIAGNOSIS — R0989 Other specified symptoms and signs involving the circulatory and respiratory systems: Secondary | ICD-10-CM | POA: Diagnosis not present

## 2021-08-04 DIAGNOSIS — R0602 Shortness of breath: Secondary | ICD-10-CM | POA: Diagnosis not present

## 2021-08-04 DIAGNOSIS — Z91128 Patient's intentional underdosing of medication regimen for other reason: Secondary | ICD-10-CM | POA: Diagnosis not present

## 2021-08-04 DIAGNOSIS — J9811 Atelectasis: Secondary | ICD-10-CM | POA: Diagnosis not present

## 2021-08-04 DIAGNOSIS — E876 Hypokalemia: Secondary | ICD-10-CM | POA: Diagnosis not present

## 2021-08-04 DIAGNOSIS — Z7901 Long term (current) use of anticoagulants: Secondary | ICD-10-CM | POA: Diagnosis not present

## 2021-08-04 DIAGNOSIS — Z608 Other problems related to social environment: Secondary | ICD-10-CM | POA: Diagnosis not present

## 2021-08-04 DIAGNOSIS — I50812 Chronic right heart failure: Secondary | ICD-10-CM | POA: Diagnosis not present

## 2021-08-04 DIAGNOSIS — R5381 Other malaise: Secondary | ICD-10-CM | POA: Diagnosis not present

## 2021-08-04 DIAGNOSIS — Z87891 Personal history of nicotine dependence: Secondary | ICD-10-CM | POA: Diagnosis not present

## 2021-08-04 DIAGNOSIS — Z20822 Contact with and (suspected) exposure to covid-19: Secondary | ICD-10-CM | POA: Diagnosis not present

## 2021-08-04 DIAGNOSIS — I50813 Acute on chronic right heart failure: Secondary | ICD-10-CM | POA: Diagnosis not present

## 2021-08-04 DIAGNOSIS — Z880 Allergy status to penicillin: Secondary | ICD-10-CM | POA: Diagnosis not present

## 2021-08-04 DIAGNOSIS — Z7951 Long term (current) use of inhaled steroids: Secondary | ICD-10-CM | POA: Diagnosis not present

## 2021-08-04 DIAGNOSIS — T380X6A Underdosing of glucocorticoids and synthetic analogues, initial encounter: Secondary | ICD-10-CM | POA: Diagnosis not present

## 2021-08-04 DIAGNOSIS — Z91040 Latex allergy status: Secondary | ICD-10-CM | POA: Diagnosis not present

## 2021-08-04 DIAGNOSIS — H903 Sensorineural hearing loss, bilateral: Secondary | ICD-10-CM | POA: Diagnosis not present

## 2021-08-04 DIAGNOSIS — J9601 Acute respiratory failure with hypoxia: Secondary | ICD-10-CM | POA: Diagnosis not present

## 2021-08-04 DIAGNOSIS — J439 Emphysema, unspecified: Secondary | ICD-10-CM | POA: Diagnosis not present

## 2021-08-04 DIAGNOSIS — J189 Pneumonia, unspecified organism: Secondary | ICD-10-CM | POA: Diagnosis not present

## 2021-08-09 DIAGNOSIS — I480 Paroxysmal atrial fibrillation: Secondary | ICD-10-CM | POA: Diagnosis not present

## 2021-08-09 DIAGNOSIS — I10 Essential (primary) hypertension: Secondary | ICD-10-CM | POA: Diagnosis not present

## 2021-08-09 DIAGNOSIS — R0602 Shortness of breath: Secondary | ICD-10-CM | POA: Diagnosis not present

## 2021-08-09 DIAGNOSIS — E876 Hypokalemia: Secondary | ICD-10-CM | POA: Diagnosis not present

## 2021-08-09 DIAGNOSIS — M7989 Other specified soft tissue disorders: Secondary | ICD-10-CM | POA: Diagnosis not present

## 2021-08-16 DIAGNOSIS — I959 Hypotension, unspecified: Secondary | ICD-10-CM | POA: Diagnosis not present

## 2021-08-16 DIAGNOSIS — I4891 Unspecified atrial fibrillation: Secondary | ICD-10-CM | POA: Diagnosis not present

## 2021-08-16 DIAGNOSIS — H913 Deaf nonspeaking, not elsewhere classified: Secondary | ICD-10-CM | POA: Diagnosis not present

## 2021-08-16 DIAGNOSIS — R8281 Pyuria: Secondary | ICD-10-CM | POA: Diagnosis not present

## 2021-08-16 DIAGNOSIS — J449 Chronic obstructive pulmonary disease, unspecified: Secondary | ICD-10-CM | POA: Diagnosis not present

## 2021-08-16 DIAGNOSIS — I5033 Acute on chronic diastolic (congestive) heart failure: Secondary | ICD-10-CM | POA: Diagnosis not present

## 2021-08-16 DIAGNOSIS — R451 Restlessness and agitation: Secondary | ICD-10-CM | POA: Diagnosis not present

## 2021-08-16 DIAGNOSIS — Z515 Encounter for palliative care: Secondary | ICD-10-CM | POA: Diagnosis not present

## 2021-08-16 DIAGNOSIS — Z66 Do not resuscitate: Secondary | ICD-10-CM | POA: Diagnosis not present

## 2021-08-16 DIAGNOSIS — Z20822 Contact with and (suspected) exposure to covid-19: Secondary | ICD-10-CM | POA: Diagnosis not present

## 2021-08-16 DIAGNOSIS — I872 Venous insufficiency (chronic) (peripheral): Secondary | ICD-10-CM | POA: Diagnosis not present

## 2021-08-16 DIAGNOSIS — R5381 Other malaise: Secondary | ICD-10-CM | POA: Diagnosis not present

## 2021-08-16 DIAGNOSIS — R0600 Dyspnea, unspecified: Secondary | ICD-10-CM | POA: Diagnosis not present

## 2021-08-16 DIAGNOSIS — R0602 Shortness of breath: Secondary | ICD-10-CM | POA: Diagnosis not present

## 2021-08-16 DIAGNOSIS — Z880 Allergy status to penicillin: Secondary | ICD-10-CM | POA: Diagnosis not present

## 2021-08-16 DIAGNOSIS — J984 Other disorders of lung: Secondary | ICD-10-CM | POA: Diagnosis not present

## 2021-08-16 DIAGNOSIS — R9431 Abnormal electrocardiogram [ECG] [EKG]: Secondary | ICD-10-CM | POA: Diagnosis not present

## 2021-08-16 DIAGNOSIS — I517 Cardiomegaly: Secondary | ICD-10-CM | POA: Diagnosis not present

## 2021-08-16 DIAGNOSIS — T380X5A Adverse effect of glucocorticoids and synthetic analogues, initial encounter: Secondary | ICD-10-CM | POA: Diagnosis not present

## 2021-08-16 DIAGNOSIS — Z87891 Personal history of nicotine dependence: Secondary | ICD-10-CM | POA: Diagnosis not present

## 2021-08-16 DIAGNOSIS — G4700 Insomnia, unspecified: Secondary | ICD-10-CM | POA: Diagnosis not present

## 2021-08-16 DIAGNOSIS — J9691 Respiratory failure, unspecified with hypoxia: Secondary | ICD-10-CM | POA: Diagnosis not present

## 2021-08-16 DIAGNOSIS — N1832 Chronic kidney disease, stage 3b: Secondary | ICD-10-CM | POA: Diagnosis not present

## 2021-08-16 DIAGNOSIS — R410 Disorientation, unspecified: Secondary | ICD-10-CM | POA: Diagnosis not present

## 2021-08-16 DIAGNOSIS — I5031 Acute diastolic (congestive) heart failure: Secondary | ICD-10-CM | POA: Diagnosis not present

## 2021-08-16 DIAGNOSIS — I081 Rheumatic disorders of both mitral and tricuspid valves: Secondary | ICD-10-CM | POA: Diagnosis not present

## 2021-08-16 DIAGNOSIS — J439 Emphysema, unspecified: Secondary | ICD-10-CM | POA: Diagnosis not present

## 2021-08-16 DIAGNOSIS — H9193 Unspecified hearing loss, bilateral: Secondary | ICD-10-CM | POA: Diagnosis not present

## 2021-08-16 DIAGNOSIS — Z7189 Other specified counseling: Secondary | ICD-10-CM | POA: Diagnosis not present

## 2021-08-16 DIAGNOSIS — I9589 Other hypotension: Secondary | ICD-10-CM | POA: Diagnosis not present

## 2021-08-16 DIAGNOSIS — F03911 Unspecified dementia, unspecified severity, with agitation: Secondary | ICD-10-CM | POA: Diagnosis not present

## 2021-08-16 DIAGNOSIS — N390 Urinary tract infection, site not specified: Secondary | ICD-10-CM | POA: Diagnosis not present

## 2021-08-16 DIAGNOSIS — H9190 Unspecified hearing loss, unspecified ear: Secondary | ICD-10-CM | POA: Diagnosis not present

## 2021-08-16 DIAGNOSIS — J9601 Acute respiratory failure with hypoxia: Secondary | ICD-10-CM | POA: Diagnosis not present

## 2021-08-16 DIAGNOSIS — N179 Acute kidney failure, unspecified: Secondary | ICD-10-CM | POA: Diagnosis not present

## 2021-08-16 DIAGNOSIS — F0390 Unspecified dementia without behavioral disturbance: Secondary | ICD-10-CM | POA: Diagnosis not present

## 2021-08-16 DIAGNOSIS — Z743 Need for continuous supervision: Secondary | ICD-10-CM | POA: Diagnosis not present

## 2021-08-16 DIAGNOSIS — E876 Hypokalemia: Secondary | ICD-10-CM | POA: Diagnosis not present

## 2021-08-16 DIAGNOSIS — I509 Heart failure, unspecified: Secondary | ICD-10-CM | POA: Diagnosis not present

## 2021-08-16 DIAGNOSIS — I130 Hypertensive heart and chronic kidney disease with heart failure and stage 1 through stage 4 chronic kidney disease, or unspecified chronic kidney disease: Secondary | ICD-10-CM | POA: Diagnosis not present

## 2021-08-16 DIAGNOSIS — R404 Transient alteration of awareness: Secondary | ICD-10-CM | POA: Diagnosis not present

## 2021-08-16 DIAGNOSIS — I4819 Other persistent atrial fibrillation: Secondary | ICD-10-CM | POA: Diagnosis not present

## 2021-08-16 DIAGNOSIS — E871 Hypo-osmolality and hyponatremia: Secondary | ICD-10-CM | POA: Diagnosis not present

## 2021-09-08 DEATH — deceased

## 2022-11-07 IMAGING — DX DG TIBIA/FIBULA 2V*L*
4 series · 4 of 4 positions shown · non-contrast
Comparison: September 24, 2006

CLINICAL DATA: lac 2 weeks ago r/o osteomyelitis

EXAM:
LEFT TIBIA AND FIBULA - 2 VIEW

[tibia ap (1 of 2)]
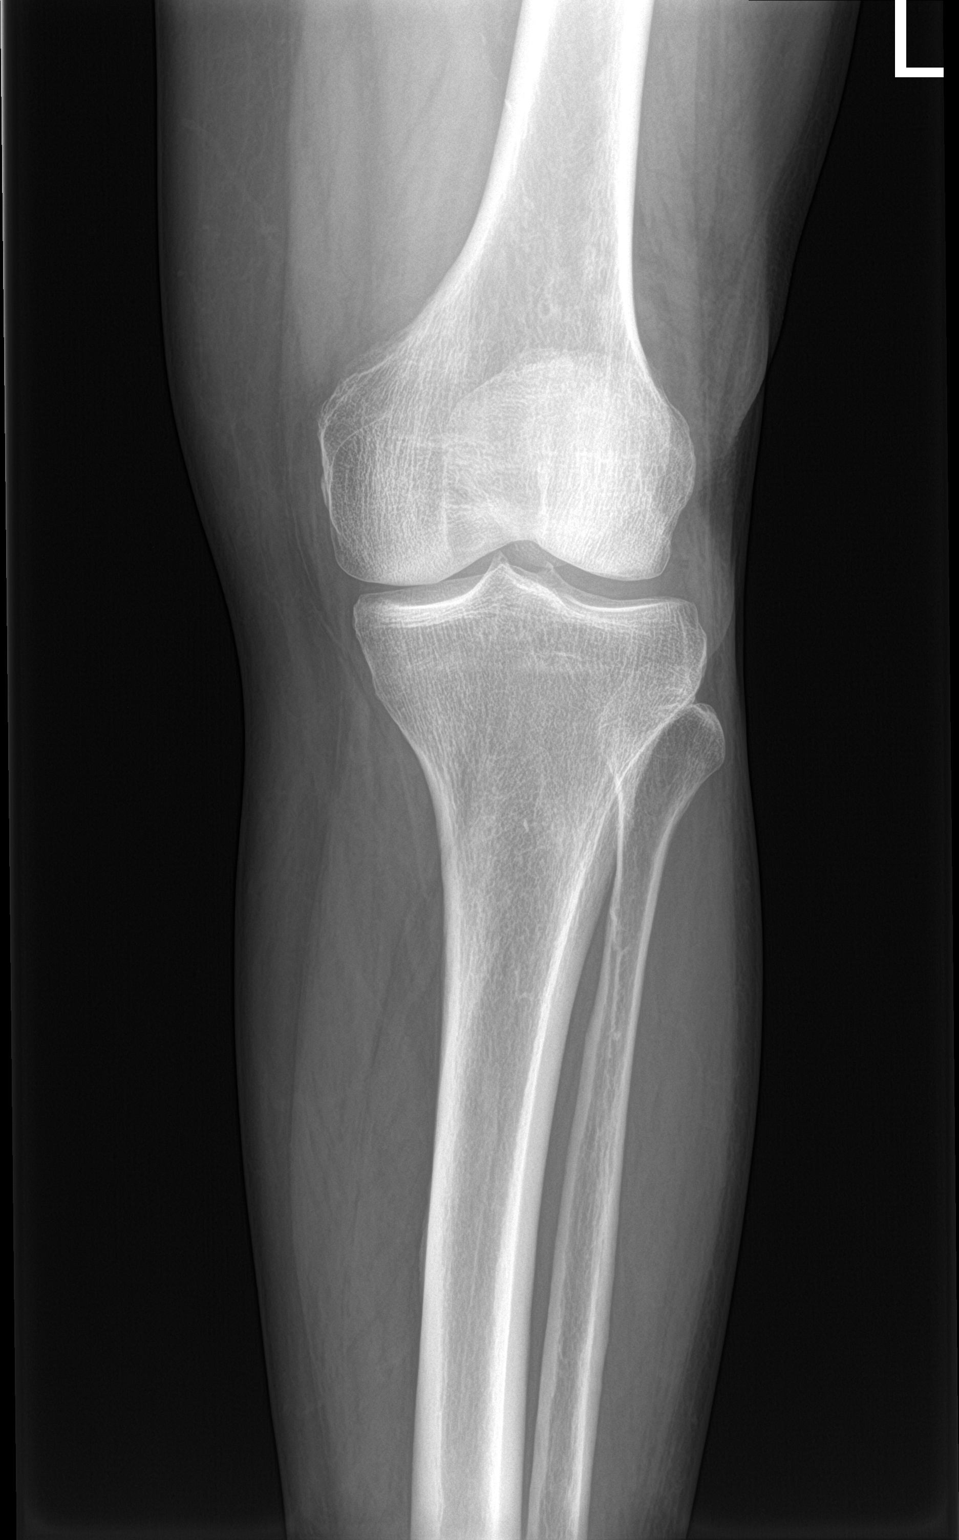

[tibia ap (2 of 2)]
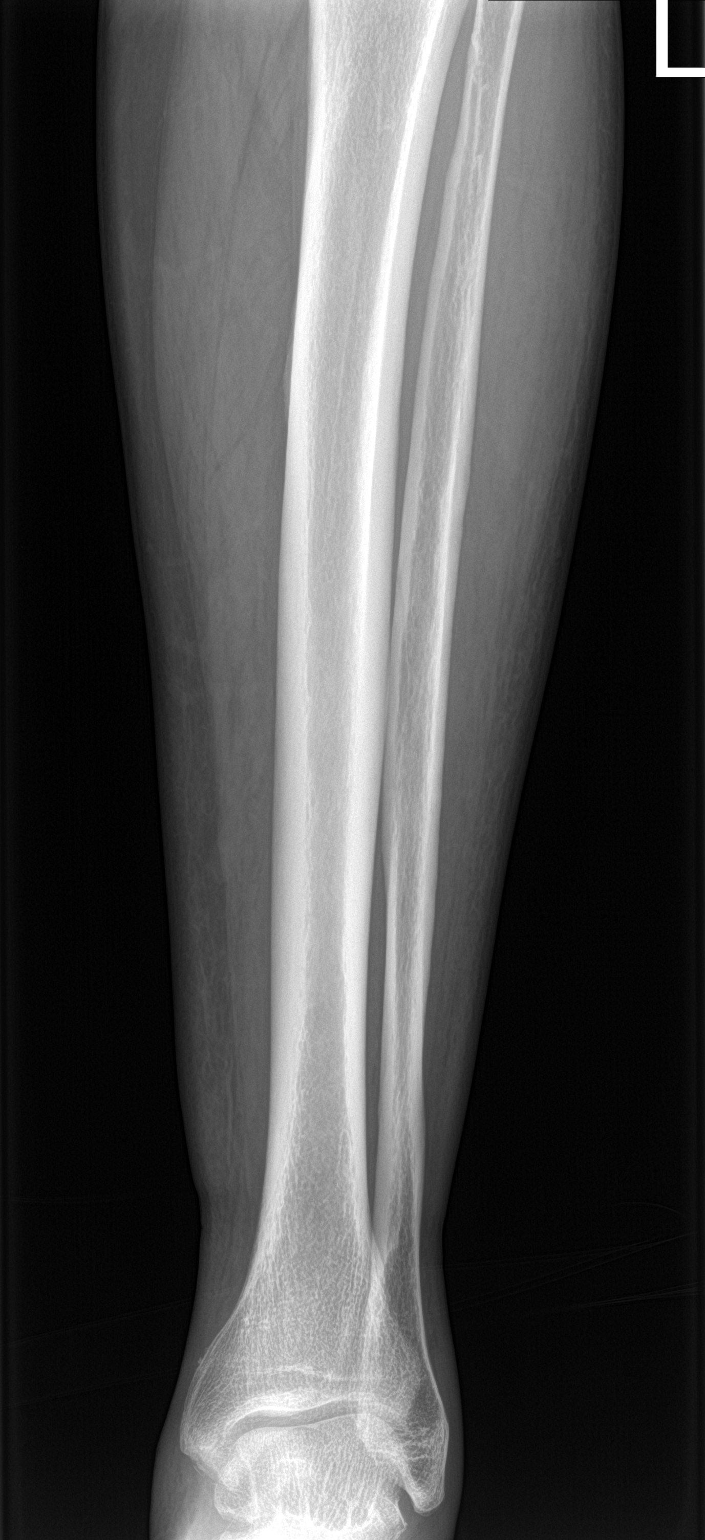

[tibia lat (1 of 2)]
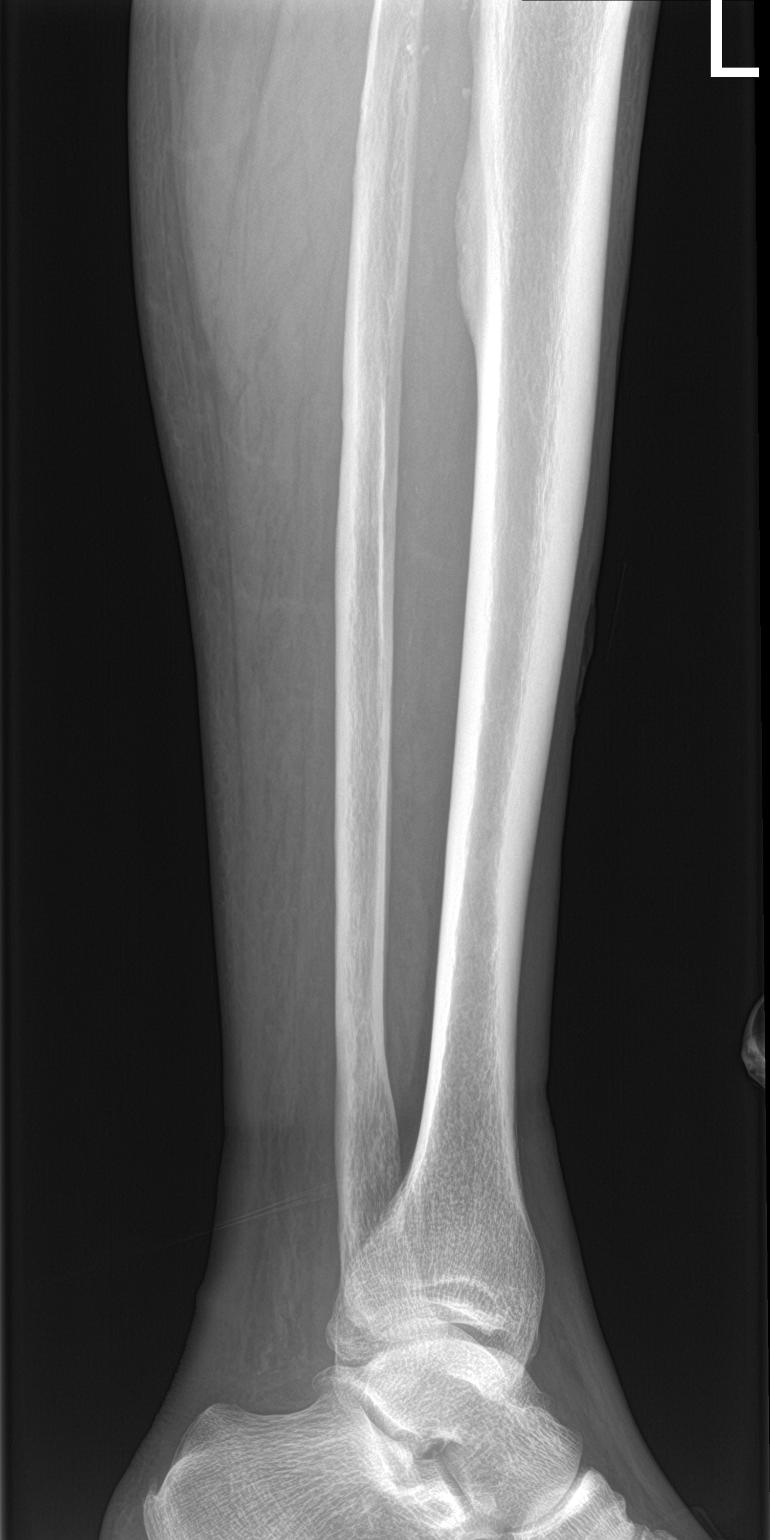

[tibia lat (2 of 2)]
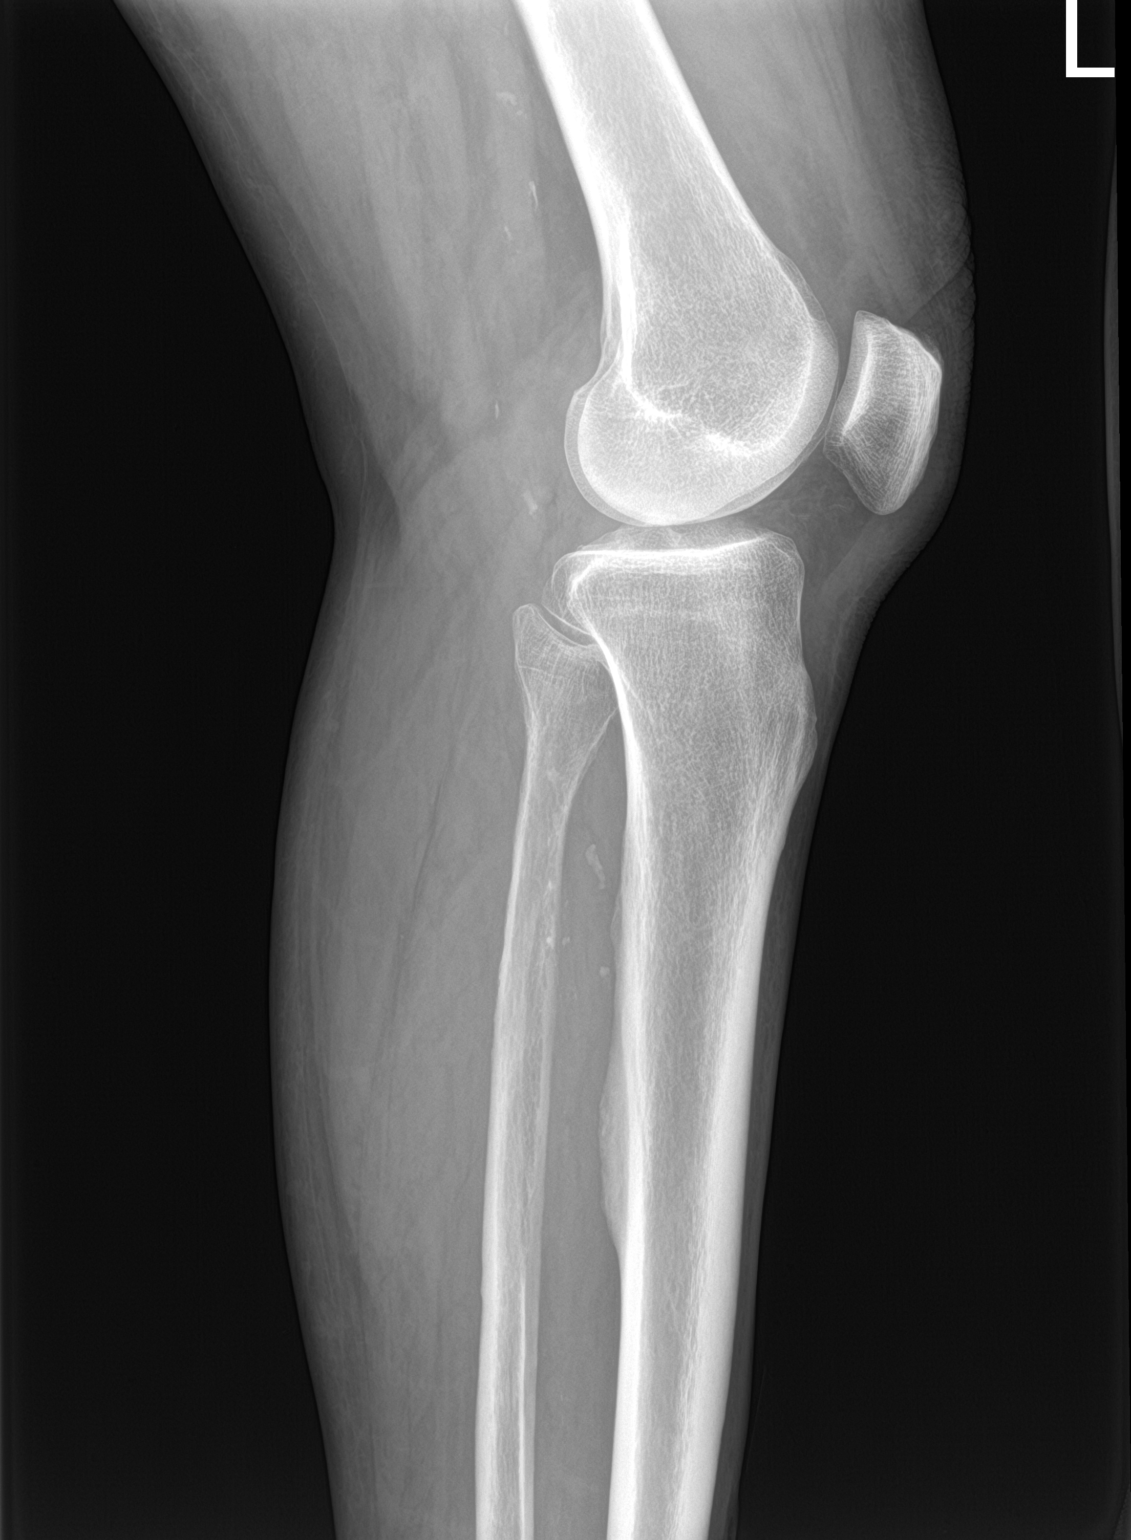

[4 of 4 positions shown; findings below may reference images not displayed]

FINDINGS: Osteopenia. No acute fracture or dislocation. Joint spaces and
alignment are maintained. Enthesophyte of the quadriceps tendon
insertion on the patella. No area of erosion or osseous destruction.
No unexpected radiopaque foreign body. Vascular calcifications. Soft
tissue irregularity of the anterior mid to lower calf.
IMPRESSION: No radiographic evidence of osteomyelitis.
